# Patient Record
Sex: Male | Born: 1980 | Race: White | Hispanic: No | Marital: Married
Health system: Midwestern US, Community
[De-identification: ages and names within clinical notes are randomized; demographics above are authoritative.]

## PROBLEM LIST (undated history)

## (undated) DIAGNOSIS — M1990 Unspecified osteoarthritis, unspecified site: Secondary | ICD-10-CM

## (undated) DIAGNOSIS — T7840XA Allergy, unspecified, initial encounter: Secondary | ICD-10-CM

## (undated) DIAGNOSIS — F32A Depression, unspecified: Secondary | ICD-10-CM

## (undated) DIAGNOSIS — F329 Major depressive disorder, single episode, unspecified: Secondary | ICD-10-CM

## (undated) DIAGNOSIS — F419 Anxiety disorder, unspecified: Secondary | ICD-10-CM

## (undated) DIAGNOSIS — F1120 Opioid dependence, uncomplicated: Secondary | ICD-10-CM

## (undated) DIAGNOSIS — G5600 Carpal tunnel syndrome, unspecified upper limb: Secondary | ICD-10-CM

## (undated) DIAGNOSIS — G8929 Other chronic pain: Secondary | ICD-10-CM

## (undated) DIAGNOSIS — M25511 Pain in right shoulder: Secondary | ICD-10-CM

## (undated) DIAGNOSIS — M2550 Pain in unspecified joint: Secondary | ICD-10-CM

## (undated) DIAGNOSIS — F410 Panic disorder [episodic paroxysmal anxiety] without agoraphobia: Secondary | ICD-10-CM

## (undated) DIAGNOSIS — S0990XD Unspecified injury of head, subsequent encounter: Secondary | ICD-10-CM

## (undated) HISTORY — DX: Depression, unspecified: F32.A

## (undated) HISTORY — DX: Allergy, unspecified, initial encounter: T78.40XA

## (undated) HISTORY — PX: BICEPT TENODESIS: SHX5116

## (undated) HISTORY — DX: Major depressive disorder, single episode, unspecified: F32.9

## (undated) HISTORY — DX: Carpal tunnel syndrome, unspecified upper limb: G56.00

## (undated) HISTORY — DX: Unspecified osteoarthritis, unspecified site: M19.90

## (undated) HISTORY — DX: Anxiety disorder, unspecified: F41.9

## (undated) HISTORY — DX: Opioid dependence, uncomplicated: F11.20

## (undated) MED ORDER — HYDROCODONE-ACETAMINOPHEN 7.5 MG-750 MG TAB
ORAL_TABLET | Freq: Four times a day (QID) | ORAL | Status: AC | PRN
Start: ? — End: 2008-01-27

## (undated) MED ORDER — HYDROCODONE-ACETAMINOPHEN 7.5 MG-500 MG TAB
ORAL_TABLET | ORAL | Status: AC | PRN
Start: ? — End: 2008-04-18

---

## 2007-04-26 MED ORDER — MEPERIDINE (PF) 50 MG/ML INJ SOLN
50 mg/ml | INTRAMUSCULAR | Status: AC
Start: 2007-04-26 — End: 2007-04-26
  Administered 2007-04-27: via INTRAMUSCULAR

## 2007-04-26 MED ORDER — PROMETHAZINE 25 MG/ML INJECTION
25 mg/mL | Freq: Four times a day (QID) | INTRAMUSCULAR | Status: DC | PRN
Start: 2007-04-26 — End: 2007-04-26
  Administered 2007-04-27: via INTRAMUSCULAR

## 2007-04-26 NOTE — ED Provider Notes (Signed)
Back Pain   The history is provided by the patient. This is a recurrent problem. The current episode started more than 1 week ago. The problem has been unchanged since onset. The problem has been occurring every several days. Patient reports work related injury.The pain is associated with lifting. The pain is present in the lumbar spine. The quality of the pain is stabbing. The pain does not radiate. The pain is at a severity of 5/10. The pain is moderate. The symptoms are worsened by bending. The pain is worse during the day. Pertinent negatives include no chest pain, no abdominal pain, no dysuria, no leg pain, no paresthesias and no weakness.   Medication Refill  Pertinent negatives include no chest pain and no abdominal pain.        Review of Systems   Constitutional: Negative.  Negative for weakness.   Skin: Negative.    HENT: Negative.    Eyes: Negative.    Cardiovascular: Negative.  Negative for chest pain.   Respiratory: Negative.    Gastrointestinal: Negative.  Negative for abdominal pain.   Genitourinary: Negative for dysuria.   Musculoskeletal: Positive for myalgias and back pain.   Neurological: Negative.        Physical Exam   Constitutional: He is oriented and developed, nourished, and not distressed.   HENT:   Head: Normocephalic.   Eyes: Pupils are equal, round, and reactive to light.   Neck: Normal range of motion.   Cardiovascular: Normal rate and regular rhythm.    Pulmonary/Chest: Effort normal and breath sounds normal.   Musculoskeletal: He exhibits tenderness.   Neurological: He is alert and oriented.   Skin: Skin is warm and dry.       Penicillins    History   Social History   ??? Marital Status: Single     Spouse Name: N/A     Number of Children: N/A   ??? Years of Education: N/A   Occupational History   ??? Not on file.   Social History Main Topics   ??? Tobacco Use: Never   ??? Alcohol Use: No   ??? Drug Use: No   ??? Sexually Active: Not Currently   Other Topics Concern   ??? Not on file    Social History Narrative   ??? No narrative on file         ED Plan:

## 2007-04-26 NOTE — ED Notes (Signed)
Pt stained lumbar back 3 wks ago. He has appt with MD next mon. Pt ran out of Lortab.

## 2007-04-26 NOTE — ED Notes (Signed)
Medication given as ordered,waiting shot time, then will discharge home

## 2007-04-26 NOTE — ED Notes (Signed)
Pt states previously seen by Dr. Constance Goltz, given pain medication and told to follow up with orthopedic, states unable to get into one until the 15th, has ran out of pain medication

## 2007-04-26 NOTE — ED Notes (Signed)
Pt states feels much better, discharge instructions given verbal and written, pt verbalizes understanding denies questions

## 2007-04-27 MED FILL — DEMEROL (PF) 50 MG/ML INJECTION SYRINGE: 50 mg/mL | INTRAMUSCULAR | Qty: 1

## 2007-04-27 MED FILL — PROMETHAZINE 25 MG/ML INJECTION: 25 mg/mL | INTRAMUSCULAR | Qty: 1

## 2008-01-20 NOTE — ED Notes (Signed)
I have reviewed discharge instructions with the patient.  The patient verbalized understanding.  To make follow up appt. with referral physician.  Return if gets worse.   Advised not to drive if taking vicodin

## 2008-01-20 NOTE — ED Provider Notes (Signed)
Shoulder Pain   The history is provided by the patient. The incident occurred more than 1 week ago (since last year). There was no injury mechanism. The right shoulder is affected. The pain is at a severity of 10/10. The pain does not radiate. There is no history of shoulder injury. He has no other injuries. There is no history of shoulder surgery.    Has small labral tear with paralabral cyst of right shoulder per MRI 12/08.  Has been seeing his PCP who is no longer there.  Has an appointment scheduled with another PCP in that group on 02/06/08 and is supposed to get a referral to an orthopedist.  Is out of his Lortab.  Has no new pain, no injury.  Just said he needs the "7.5s" to get by.    Past Medical History   Diagnosis Date   ??? Psychiatric Disorder      PTSD          No past surgical history on file.      No family history on file.     History   Social History   ??? Marital Status: Single     Spouse Name: N/A     Number of Children: N/A   ??? Years of Education: N/A   Occupational History   ??? Not on file.   Social History Main Topics   ??? Tobacco Use: Never   ??? Alcohol Use: No   ??? Drug Use: No   ??? Sexually Active: Not Currently   Other Topics Concern   ??? Not on file   Social History Narrative   ??? No narrative on file           ALLERGIES: Penicillins and Erythromycin      Review of Systems   Musculoskeletal: Positive for joint pain.   All other systems reviewed and are negative.      Filed Vitals:    01/20/2008  1:17 PM   BP: 123/86   Pulse: 79   Temp: 98.4 ??F (36.9 ??C)   Resp: 16   Height: 5\' 11"  (1.803 m)   Weight: 219 lb (99.338 kg)   SpO2: 99%              Physical Exam   Nursing note and vitals reviewed.  Constitutional: He is oriented. He appears well-developed and well-nourished. No distress.   HENT:   Head: Normocephalic and atraumatic.   Right Ear: External ear normal.   Left Ear: External ear normal.   Eyes: Conjunctivae are normal. Pupils are equal, round, and reactive to light.    Neck: Normal range of motion. Neck supple.   Cardiovascular: Normal rate and normal heart sounds.  Exam reveals no gallop and no friction rub.    No murmur heard.  Pulmonary/Chest: Effort normal. No respiratory distress. He has no wheezes. He has no rales.   Abdominal: He exhibits no distension. No tenderness.   Musculoskeletal: He exhibits tenderness. He exhibits no edema.   Neurological: He is alert and oriented.   Skin: Skin is warm and dry.   Psychiatric: He has a normal mood and affect. His behavior is normal.        MDM Coding   Reviewed: previous chart, nursing note and vitals

## 2008-01-20 NOTE — ED Notes (Signed)
Saw PMD had MRI showed torn labrum in r shoulder.  Called PMD back for referral to ortho, PMD not at practice.  Has another appt with new family practice dr for referral to ortho in 2 weeks.  Has run out of pain pills.  Does physical labor for job

## 2008-02-07 NOTE — ED Notes (Signed)
Signed chart for administrative requirement but I did not see this patient.

## 2008-04-11 MED ORDER — METHYLPREDNISOLONE 80 MG/ML SUSP FOR INJECTION
80 mg/mL | INTRAMUSCULAR | Status: AC
Start: 2008-04-11 — End: 2008-04-11
  Administered 2008-04-11: 20:00:00 via INTRAMUSCULAR

## 2008-04-11 MED FILL — METHYLPREDNISOLONE 80 MG/ML SUSP FOR INJECTION: 80 mg/mL | INTRAMUSCULAR | Qty: 1

## 2008-04-11 NOTE — ED Notes (Signed)
Pt initially refused DepoMedrol if he was not going to get any pain med.  In reviewing d/c instructions, I informed pt that it appeared that a rx for lortab was to be given.  Pt then agreed to IM DepoMedrol

## 2008-04-11 NOTE — ED Notes (Incomplete)
The patient was observed in the ED.    Results Reviewed:        I discussed the results of all labs, procedures, radiographs, and treatments with the patient and available family.  Treatment plan is agreed upon and the patient is ready for discharge.  All voiced understanding of the discharge plan and medication instructions or changes as appropriate.  Questions about treatment in the ED were answered.  All were encouraged to return should symptoms worsen or new problems develop.

## 2008-04-11 NOTE — ED Provider Notes (Signed)
HPI Comments: Pt informed that The ER is not the place to come for his medication refills    Shoulder Pain   The history is provided by the patient. The incident occurred more than 1 week ago. The injury mechanism is unknown. The right shoulder is affected. The pain is at a severity of 6/10. The pain has been constant since onset. The pain radiates. There is a history of shoulder injury. He has no other injuries. There is no history of shoulder surgery. Pertinent negatives include no numbness and no tingling. He reports no foreign bodies present.   Back Pain   Pertinent negatives include no fever, no numbness, no tingling and no weakness.   Medication Reaction         Past Medical History   Diagnosis Date   ??? Psychiatric Disorder      PTSD   ??? Other Ill-Defined Conditions      torn labrum          No past surgical history on file.      No family history on file.     History   Social History   ??? Marital Status: Single     Spouse Name: N/A     Number of Children: N/A   ??? Years of Education: N/A   Occupational History   ??? Not on file.   Social History Main Topics   ??? Tobacco Use: Never   ??? Alcohol Use: No   ??? Drug Use: No   ??? Sexually Active: Yes -- Male partner(s)     Birth Control/ Protection: Pill   Other Topics Concern   ??? Not on file   Social History Narrative   ??? No narrative on file           ALLERGIES: Penicillins and Erythromycin      Review of Systems   Constitutional: Negative for fever and chills.   Musculoskeletal: Positive for back pain and arthralgias.   Neurological: Negative for tingling, weakness and numbness.   All other systems reviewed and are negative.      Filed Vitals:    04/11/2008  1:38 PM   BP: 124/87   Pulse: 89   Temp: 98.2 ??F (36.8 ??C)   Resp: 20   SpO2: 97%              Physical Exam   Nursing note and vitals reviewed.  Constitutional: He is oriented. He appears well-developed and well-nourished. No distress.   HENT:   Head: Normocephalic and atraumatic.    Eyes: Conjunctivae are normal. Pupils are equal, round, and reactive to light.   Neck: Normal range of motion. Neck supple.   Cardiovascular: Normal rate and regular rhythm.    Pulmonary/Chest: Effort normal. No respiratory distress.   Musculoskeletal:        Right shoulder: He exhibits decreased range of motion, tenderness and pain. He exhibits no deformity.   Neurological: He is alert and oriented. He has normal strength. No sensory deficit.   Skin: Skin is warm and dry.   Psychiatric: He has a normal mood and affect. His behavior is normal.            MDM Coding   Reviewed: previous chart, nursing note and vitals  Reviewed previous: x-ray          Procedures

## 2008-04-11 NOTE — ED Notes (Signed)
Pt has known torn labrum in shoulder.  Also previous back injury.  Has run out of pain meds and does not have surgery scheduled until July.  States MD said to come to ER for meds

## 2008-11-03 MED ORDER — PAROXETINE 20 MG TAB
20 mg | ORAL_TABLET | Freq: Every day | ORAL | Status: AC
Start: 2008-11-03 — End: 2008-12-03

## 2008-11-03 NOTE — ED Notes (Signed)
Dr. Loni Beckwith aware Paxil voided and ripped and placed in shredder.

## 2008-11-03 NOTE — ED Notes (Signed)
Pt called coordinator phone and this RN using to call report on critical pt. Informed pt that I would report to his room as soon as I could to speak to him regarding his concerns. Prior to going to room 16, informed Joy, RN, Higher education careers adviser, of the situation. Joy and I went and obtained discharge rx and instructions. Went and spoke with pt and informed him the MD did evaluate him and that the RN has updated him on delay of MD due to emergencies with pts. Pt further states "my shoulder pain is an emergency." Both RNs apologized for delay and this RN started giving Rx and discharge instructions. Pt and his friend interrupted stating "that won't work... I need my anxiety medicine... I have seizures if I don't take it... My last dose was this morning." explained to pt that MD filled his Paxil and that he should call his MD in the morning. Per pt, MD fired him, but later stated "I can't go until I pay him $300 because of my insurance dropping me." Joy, RN explained as well what the emergency room and the MD can do for him today. Both RNs spoke with pt (and his friend in the room) at length. Pt very rude and demeaning to staff, and would speak to friend stating "[facility] gave me a supply." Gave pt resources and offered Paxil rx numerous times. States "nope... What I really need is my Xanax...." I will add that pt only had empty rx bottles for Xanax and Lortab. Pt got up and states "I'll just go by them on the streets" and his friend said "yeah dude that's right..." and pt states "It's a lot easier than this shit." pt states "I'm not paying the bill either..." then walked out of ER with his friend, both still talking and making rude comments. Inetta Fermo with pt relations notified and Dr. Loni Beckwith notified.

## 2008-11-03 NOTE — ED Notes (Signed)
Pt states he called his doctor for refills, and his doctor told him to come to the ER for referral to a different physician.

## 2008-11-03 NOTE — ED Notes (Signed)
Wants meds refilled. States MD won't refill bc insurance ran out. States $300 to go. Needs meds: Paxil CR 12.5 mg 1 tab daily; Xanax 2 mg 1 tab 3 times daily; Lortab 7.5 mg 1 tab every 4-6 hrs as needed.

## 2008-11-03 NOTE — ED Provider Notes (Signed)
HPI Comments: Presents requesting Lortab, Xanax, & Paxil refills claiming his PCP released him from his practice d/t losing his insurance and told him to go to an ER for his refills.   Attempted to explain hospital policy is to refill only essential medications wh  Emeline Gins excludes pain & anxiety.   Pt then requests to "start all over" after reading the statement on the wall to me that he is entitled to a medical screening exam; however, when I attempted to examine him he asked me to stop because it caused pain and   now wants an MRI of his shoulder.   I apologized for the long wait, but I am unable to placate pt.     Patient is a 28 y.o. male presenting with medication refill. The history is provided by the patient.   Medication Refill         Past Medical History   Diagnosis Date   ??? Psychiatric Disorder      PTSD   ??? Other Ill-Defined Conditions      torn labrum          No past surgical history on file.      No family history on file.     History   Social History   ??? Marital Status: Married     Spouse Name: N/A     Number of Children: N/A   ??? Years of Education: N/A   Occupational History   ??? Not on file.   Social History Main Topics   ??? Tobacco Use: Never   ??? Alcohol Use: No   ??? Drug Use: No   ??? Sexually Active: Yes -- Male partner(s)     Birth Control/ Protection: Pill   Other Topics Concern   ??? Not on file   Social History Narrative   ??? No narrative on file           ALLERGIES: Penicillins and Erythromycin      Review of Systems   Unable to perform ROS  Musculoskeletal: Positive for arthralgias.   Psychiatric/Behavioral: Positive for agitation. The patient is nervous/anxious.        Filed Vitals:    11/03/2008  1:29 PM   BP: 128/79   Pulse: 78   Temp: 97.9 ??F (36.6 ??C)   Resp: 16   Height: 5\' 11"  (1.803 m)   Weight: 225 lb (102.059 kg)   SpO2: 99%              Physical Exam   Vitals reviewed.  Constitutional: He appears well-developed. No distress.   HENT:   Head: Normocephalic.    Cardiovascular: Normal rate.    Pulmonary/Chest: Effort normal.   Musculoskeletal:        Right shoulder: He exhibits tenderness and bony tenderness.        Arms:  Neurological: He is alert.   Psychiatric: His affect is angry. He is agitated and aggressive.            Coding      Procedures

## 2008-11-03 NOTE — Progress Notes (Signed)
Spiritual care visit with pt.  Support  given.    Cindy Bishop, M.Div.  Chaplain

## 2009-01-26 MED ORDER — CEPHALEXIN 500 MG CAP
500 mg | ORAL_CAPSULE | Freq: Three times a day (TID) | ORAL | Status: AC
Start: 2009-01-26 — End: 2009-02-05

## 2009-01-26 MED ORDER — HYDROCODONE-ACETAMINOPHEN 5 MG-500 MG TAB
5-500 mg | ORAL_TABLET | ORAL | Status: DC | PRN
Start: 2009-01-26 — End: 2009-06-05

## 2009-01-26 MED ORDER — PSEUDOEPHEDRINE SR 120 MG TAB
120 mg | ORAL_TABLET | Freq: Two times a day (BID) | ORAL | Status: DC | PRN
Start: 2009-01-26 — End: 2009-06-05

## 2009-01-26 NOTE — ED Provider Notes (Signed)
HPI Comments: Pt. Presents to ER stating that he has had severe right ear pain with fullness x 3 days. He states he is starting to have fullness and pressure in left ear today. He denies any URI symptoms or ST. Denies dizziness, or tinnitus.    The history is provided by the patient.        Past Medical History   Diagnosis Date   ??? Psychiatric Disorder      PTSD   ??? Other Ill-Defined Conditions      torn labrum          No past surgical history on file.      No family history on file.     History   Social History   ??? Marital Status: Married     Spouse Name: N/A     Number of Children: N/A   ??? Years of Education: N/A   Occupational History   ??? Not on file.   Social History Main Topics   ??? Tobacco Use: Never   ??? Alcohol Use: No   ??? Drug Use: No   ??? Sexually Active: Yes -- Male partner(s)     Birth Control/ Protection: Pill   Other Topics Concern   ??? Not on file   Social History Narrative   ??? No narrative on file           ALLERGIES: Penicillins and Erythromycin      Review of Systems   Constitutional: Negative for fever and chills.   HENT: Positive for ear pain. Negative for congestion, sore throat, rhinorrhea, neck pain and neck stiffness.    Gastrointestinal: Negative for nausea and vomiting.   Neurological: Negative for dizziness, light-headedness and headaches.   All other systems reviewed and are negative.        Filed Vitals:    01/26/2009  1:48 PM   BP: 143/93   Pulse: 70   Temp: 98 ??F (36.7 ??C)   Resp: 16   Height: 5\' 11"  (1.803 m)   Weight: 215 lb (97.523 kg)   SpO2: 98%              Physical Exam   Nursing note and vitals reviewed.  Constitutional: He is oriented. He appears well-developed and well-nourished. He is active.  Non-toxic appearance. He does not appear ill. He appears distressed.   HENT:   Head: Normocephalic and atraumatic.   Right Ear: Tympanic membrane is injected, erythematous and bulging.   Left Ear: Tympanic membrane is bulging.   Nose: Nose normal.    Mouth/Throat: Uvula is midline, oropharynx is clear and moist and mucous membranes are normal.        Right TM bulging, erythematous, and injected. Left TM with air-fluid levels.   Eyes: Conjunctivae and extraocular motions are normal. Pupils are equal, round, and reactive to light.   Neck: Normal range of motion. Neck supple.   Cardiovascular: Normal rate, regular rhythm and normal heart sounds.  Exam reveals no gallop and no friction rub.    No murmur heard.  Pulmonary/Chest: Effort normal and breath sounds normal. No respiratory distress. He has no decreased breath sounds. He has no wheezes. He has no rhonchi.   Lymphadenopathy:     He has no cervical adenopathy.   Neurological: He is alert and oriented. No cranial nerve deficit. Coordination normal.   Skin: Skin is warm and dry.   Psychiatric: He has a normal mood and affect. His speech is normal and behavior is normal.  MDM Coding   Reviewed: nursing note and vitals          Procedures

## 2009-01-26 NOTE — ED Notes (Signed)
C/o ear pain in right ear now moving to left ear x 3 days.

## 2009-01-26 NOTE — ED Notes (Signed)
Discharge instructions discussed with verbal understanding.

## 2009-01-26 NOTE — ED Notes (Signed)
I had no involvement in this patient's care or disposition.   This signature is to meet an administrative requirement.

## 2009-01-26 NOTE — ED Notes (Signed)
Advised of delay for a room. Also advised if they feel their condition changes, or they request a recheck, to please present to registration and I will gladly re-assess.

## 2009-06-05 MED ORDER — NAPROXEN SODIUM 550 MG TAB
550 mg | ORAL_TABLET | Freq: Three times a day (TID) | ORAL | Status: DC
Start: 2009-06-05 — End: 2009-08-15

## 2009-06-05 MED ORDER — PROPOXYPHENE N-ACETAMINOPHEN 100 MG-650 MG TAB
100-650 mg | ORAL_TABLET | Freq: Four times a day (QID) | ORAL | Status: AC | PRN
Start: 2009-06-05 — End: 2009-06-12

## 2009-06-05 NOTE — ED Notes (Signed)
I have reviewed discharge instructions with the patient and caregiver.  The patient and caregiver verbalized understanding. Pt d/c in NAD, ambulatory upon d/c.
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
## 2009-08-15 NOTE — ED Notes (Signed)
Patient complaining of throat pain. Patient also has lost voice. Afebrile been treating with ASA.

## 2009-08-15 NOTE — ED Notes (Signed)
Report given to oncoming RN at bedside. Opportunity for questions allowed.

## 2009-08-15 NOTE — ED Notes (Signed)
Pt states he developed sore throat yesterday and bilateral ear pain approximately three hours ago. Denies and fever or cough.

## 2009-08-15 NOTE — ED Provider Notes (Signed)
HPI Comments: Here with sore throat /pain to swallow for about one day. No lower respiratory involvement. Not a smoker.voice is hoarse.    Patient is a 28 y.o. male presenting with sore throat and ear pain. The history is provided by the patient.   Sore Throat   This is a new problem. The current episode started yesterday. The problem has not changed since onset. There has been no fever. Associated symptoms include ear pain and trouble swallowing. Pertinent negatives include no diarrhea, no vomiting, no congestion, no ear discharge, no headaches, no shortness of breath, no swollen glands, no stiff neck and no cough. He has had no exposure to strep and no exposure to mono.   Ear Pain   Associated symptoms include sore throat. Pertinent negatives include no ear discharge, no headaches, no diarrhea, no vomiting and no cough.        Past Medical History   Diagnosis Date   ??? Psychiatric disorder      PTSD   ??? Other ill-defined conditions      torn labrum          No past surgical history on file.      No family history on file.     History   Social History   ??? Marital Status: Married     Spouse Name: N/A     Number of Children: N/A   ??? Years of Education: N/A   Occupational History   ??? Not on file.   Social History Main Topics   ??? Tobacco Use: Never   ??? Alcohol Use: No   ??? Drug Use: No   ??? Sexually Active: Yes -- Male partner(s)     Birth Control/ Protection: Pill   Other Topics Concern   ??? Not on file   Social History Narrative   ??? No narrative on file           ALLERGIES: Penicillins and Erythromycin      Review of Systems   Constitutional: Negative for fever and chills.   HENT: Positive for ear pain, sore throat and trouble swallowing. Negative for congestion and ear discharge.    Respiratory: Negative for cough and shortness of breath.    Gastrointestinal: Negative for vomiting and diarrhea.   Neurological: Negative for headaches.   All other systems reviewed and are negative.        Filed Vitals:     08/15/2009  8:44 PM   BP: 140/86   Pulse: 90   Temp: 98.4 ??F (36.9 ??C)   Resp: 18   Height: 5\' 11"  (1.803 m)   Weight: 220 lb (99.791 kg)   SpO2: 98%              Physical Exam   Constitutional: He is oriented to person, place, and time. He appears well-developed.   HENT:   Head: Atraumatic. No trismus in the jaw.   Right Ear: Tympanic membrane and ear canal normal.   Left Ear: Tympanic membrane normal.   Nose: Nose normal.   Mouth/Throat: No uvula swelling. No oropharyngeal exudate or posterior oropharyngeal edema.   Neck: Normal range of motion and full passive range of motion without pain. Tracheal tenderness present. No tracheal deviation and no edema present.   Cardiovascular: Normal rate, normal heart sounds and intact distal pulses.    Pulmonary/Chest: Effort normal. No stridor. No respiratory distress. He has no wheezes.   Abdominal: Soft.   Musculoskeletal: Normal range of motion.   Neurological: He  is alert and oriented to person, place, and time.   Skin: Skin is warm and dry. No erythema.   Psychiatric: He has a normal mood and affect. His behavior is normal. Thought content normal.        MDM Coding   Reviewed: nursing note and vitals  Interpretation: labs        Procedures    Recent Results (from the past 12 hour(s))   STREP AG SCREEN, GROUP A    Collection Time    08/15/09 11:50 PM   Component Value Range   ??? Group A Strep Ag ID NEGATIVE   NEGATIVE

## 2009-08-16 LAB — STREP AG SCREEN, GROUP A: Group A Strep Ag ID: NEGATIVE

## 2009-08-16 MED ORDER — PREDNISONE 20 MG TAB
20 mg | ORAL | Status: AC
Start: 2009-08-16 — End: 2009-08-16
  Administered 2009-08-16: 05:00:00 via ORAL

## 2009-08-16 MED ORDER — HYDROCODONE-ACETAMINOPHEN 7.5 MG-500 MG TAB
ORAL_TABLET | Freq: Four times a day (QID) | ORAL | Status: AC | PRN
Start: 2009-08-16 — End: 2009-08-18

## 2009-08-16 MED ORDER — METHYLPREDNISOLONE 4 MG TABS IN A DOSE PACK
4 mg | PACK | ORAL | Status: DC
Start: 2009-08-16 — End: 2009-09-17

## 2009-08-16 MED ORDER — DOXYCYCLINE HYCLATE 100 MG TAB
100 mg | ORAL_TABLET | Freq: Two times a day (BID) | ORAL | Status: AC
Start: 2009-08-16 — End: 2009-08-23

## 2009-08-16 MED FILL — PREDNISONE 20 MG TAB: 20 mg | ORAL | Qty: 3

## 2009-08-16 NOTE — ED Notes (Signed)
Patient signed his discharge but computer did not accept it and patient was gone when realized this.    I have reviewed discharge instructions with the patient.  The patient verbalized understanding.

## 2009-08-16 NOTE — ED Notes (Signed)
Throat culture recollected at 2355;  Had been sent to lab by previous shift with no patient label.  Patient advised of situation.

## 2009-08-18 LAB — CULTURE, STREP THROAT

## 2009-08-23 NOTE — ED Notes (Signed)
Pt was seen here last Friday for similar symptoms.  Pt presents tonight w/ cough, sore throat "and it hurts my lungs to take a deep breath. My mom thinks I have pneumonia, so I came back in."  Pt states he finished his medrol pack and antiobiotics.

## 2009-08-24 MED ADMIN — ketorolac tromethamine (TORADOL) 60 mg/2 mL injection 60 mg: INTRAMUSCULAR | @ 05:00:00 | NDC 10019003017

## 2009-08-24 MED FILL — KETOROLAC TROMETHAMINE 60 MG/2 ML IM: 60 mg/2 mL | INTRAMUSCULAR | Qty: 2

## 2009-08-24 NOTE — ED Provider Notes (Signed)
Patient is a 28 y.o. male presenting with cough and shortness of breath. The history is provided by the patient. No language interpreter was used.   Cough  This is a recurrent problem. The current episode started more than 1 week ago (x 10 days). The problem occurs constantly. The problem has been gradually improving. The cough is non-productive. There has been no fever. Associated symptoms include chest pain, sore throat and shortness of breath. Pertinent negatives include no chills, no sweats, no weight loss, no eye redness, no ear congestion, no ear pain, no headaches, no rhinorrhea, no myalgias, no wheezing, no nausea, no vomiting and no confusion. He has tried antibiotics, prescription drugs and steroids for the symptoms. The treatment provided mild relief. He is not a smoker. His past medical history is significant for bronchitis. His past medical history does not include pneumonia, bronchiectasis, COPD, emphysema, asthma, cancer, heart failure or CHF.   Shortness of Breath  This is a new problem. The current episode started 12 to 24 hours ago. Associated symptoms include sore throat, cough and chest pain. Pertinent negatives include no headaches, no rhinorrhea, no ear pain, no wheezing and no vomiting. It is unknown what precipitated the problem. He has tried nothing for the symptoms. He has had no prior hospitalizations. He has had prior ED visits. Associated medical issues do not include asthma, COPD, pneumonia, chronic lung disease, PE, CAD, heart failure, past MI, DVT or recent surgery.        Past Medical History   Diagnosis Date   ??? Psychiatric disorder      PTSD   ??? Other ill-defined conditions      torn labrum          No past surgical history on file.      No family history on file.     History   Social History   ??? Marital Status: Married     Spouse Name: N/A     Number of Children: N/A   ??? Years of Education: N/A   Occupational History   ??? Not on file.   Social History Main Topics    ??? Tobacco Use: Never   ??? Alcohol Use: No   ??? Drug Use: No   ??? Sexually Active: Yes -- Male partner(s)     Birth Control/ Protection: Pill   Other Topics Concern   ??? Not on file   Social History Narrative   ??? No narrative on file           ALLERGIES: Penicillins and Erythromycin      Review of Systems   Constitutional: Negative for chills and weight loss.   HENT: Positive for sore throat. Negative for ear pain and rhinorrhea.    Eyes: Negative for redness.   Respiratory: Positive for cough and shortness of breath. Negative for wheezing.    Cardiovascular: Positive for chest pain.   Gastrointestinal: Negative for nausea and vomiting.   Musculoskeletal: Negative for myalgias.   Neurological: Negative for headaches.   Psychiatric/Behavioral: Negative for confusion.   All other systems reviewed and are negative.        Filed Vitals:    08/23/2009 11:56 PM   BP: 160/87   Pulse: 86   Temp: 98.4 ??F (36.9 ??C)   Resp: 18   Height: 5\' 11"  (1.803 m)   Weight: 220 lb (99.791 kg)   SpO2: 100%              Physical Exam   Nursing  note and vitals reviewed.  Constitutional: He is oriented to person, place, and time. He appears well-developed and well-nourished. No distress.   HENT:   Head: Normocephalic and atraumatic.   Right Ear: External ear normal.   Left Ear: External ear normal.   Nose: Nose normal.   Mouth/Throat: Oropharynx is clear and moist. No oropharyngeal exudate.   Eyes: Conjunctivae and extraocular motions are normal. Pupils are equal, round, and reactive to light. Right eye exhibits no discharge. Left eye exhibits no discharge. No scleral icterus.   Neck: Normal range of motion. Neck supple. No JVD present. No tracheal deviation present. No thyromegaly present.   Cardiovascular: Normal rate, regular rhythm, normal heart sounds and intact distal pulses.  Exam reveals no gallop and no friction rub.    No murmur heard.   Pulmonary/Chest: Effort normal and breath sounds normal. No stridor. No respiratory distress. He has no wheezes. He has no rales. He exhibits no tenderness.   Abdominal: Soft. Bowel sounds are normal. He exhibits no distension and no mass. No tenderness. He has no rebound and no guarding.   Musculoskeletal: Normal range of motion. He exhibits no edema and no tenderness.   Lymphadenopathy:     He has no cervical adenopathy.   Neurological: He is alert and oriented to person, place, and time. No cranial nerve deficit. He exhibits normal muscle tone. Coordination normal.   Skin: Skin is warm and dry. No rash noted. He is not diaphoretic. No erythema. No pallor.   Psychiatric: He has a normal mood and affect. His behavior is normal.        MDM Coding   Interpretation: x-ray        Procedures    The patient was observed in the ED.  PE was unremarkable.  No coughing or SOB during his visit.    Results Reviewed:  CXR - NAD    I discussed the results of the radiographs with the patient.  Treatment plan is agreed upon and the patient is ready for discharge.  All voiced understanding of the discharge plan and medication instructions or changes as appropriate.  Questions about treatment in the ED were answered.  All were encouraged to return should symptoms worsen or new problems develop.

## 2009-08-24 NOTE — ED Notes (Signed)
Discharge instructions covered verbally.  Pt verbalized understanding.

## 2009-09-17 MED FILL — BSS INTRAOCULAR SOLUTION: INTRAOCULAR | Qty: 15

## 2009-09-17 MED FILL — TETRACAINE 0.5 % EYE DROPS: 0.5 % | OPHTHALMIC | Qty: 2

## 2009-09-17 MED FILL — FUL-GLO 1 MG EYE STRIPS: 1 mg | OPHTHALMIC | Qty: 1

## 2009-09-17 NOTE — ED Notes (Signed)
I have reviewed discharge instructions with the patient.  The patient verbalized understanding.Pt discharged ambulatory in stable condition with rx's and instructions.

## 2009-09-17 NOTE — ED Notes (Signed)
Advised of delay for a room. Also advised if they feel their condition changes, or they request a recheck, to please present to registration and I will gladly re-assess.

## 2009-09-17 NOTE — ED Provider Notes (Cosign Needed)
Patient is a 28 y.o. male presenting with eye pain. The history is provided by the patient.   Eye Pain   This is a new problem. The current episode started 6 to 12 hours ago. The problem occurs constantly. The problem has been gradually improving. There is pain in the right eye. The injury mechanism was a foreign body (was working under car and something dropped in right eye). The pain is at a severity of 6/10. There is no history of trauma to the eye. There is no known exposure to pink eye. He does not wear contacts. Associated symptoms include foreign body sensation, eye redness, negative and pain. Pertinent negatives include no numbness, no blurred vision, no decreased vision, no discharge, no double vision, no photophobia, no nausea, no vomiting, no tingling, no weakness, no itching, no fever, no blindness, no Head Injury and no dizziness. He has tried water for the symptoms. The treatment provided mild relief.        Past Medical History   Diagnosis Date   ??? Psychiatric disorder      PTSD   ??? Other ill-defined conditions      torn labrum          No past surgical history on file.      No family history on file.     History   Social History   ??? Marital Status: Single     Spouse Name: N/A     Number of Children: N/A   ??? Years of Education: N/A   Occupational History   ??? Not on file.   Social History Main Topics   ??? Tobacco Use: Never   ??? Alcohol Use: No   ??? Drug Use: No   ??? Sexually Active: Yes -- Male partner(s)     Birth Control/ Protection: Pill   Other Topics Concern   ??? Not on file   Social History Narrative   ??? No narrative on file           ALLERGIES: Penicillins and Erythromycin      Review of Systems   Constitutional: Negative.  Negative for fever and chills.   HENT: Negative.  Negative for ear pain, congestion, sore throat, rhinorrhea, sneezing, drooling, mouth sores, trouble swallowing, neck pain, neck stiffness, voice change, postnasal drip and sinus pressure.     Eyes: Positive for pain and redness. Negative for blindness, blurred vision, double vision, photophobia and discharge.   Respiratory: Negative.  Negative for cough, chest tightness and shortness of breath.    Cardiovascular: Negative.  Negative for chest pain, palpitations and leg swelling.   Gastrointestinal: Negative.  Negative for nausea, vomiting, abdominal pain, diarrhea and constipation.   Genitourinary: Negative.  Negative for dysuria and hematuria.   Musculoskeletal: Negative.  Negative for myalgias and arthralgias.   Skin: Negative.  Negative for rash and itching.   Neurological: Negative.  Negative for dizziness, tingling, weakness, light-headedness, numbness and headaches.   Psychiatric/Behavioral: Negative.    All other systems reviewed and are negative.        Filed Vitals:    09/17/2009  5:13 PM   BP: 135/82   Pulse: 87   Temp: 98 ??F (36.7 ??C)   Resp: 16   Height: 5\' 11"  (1.803 m)   Weight: 225 lb (102.059 kg)   SpO2: 94%              Physical Exam   Nursing note and vitals reviewed.  Constitutional: He is oriented to person,  place, and time. He appears well-developed and well-nourished. No distress.   HENT:   Head: Normocephalic and atraumatic.   Eyes: Conjunctivae and extraocular motions are normal. Pupils are equal, round, and reactive to light.   Slit lamp exam:       The right eye shows no corneal abrasion, no corneal flare, no corneal ulcer, no foreign body and no fluorescein uptake.   Neck: Normal range of motion. Neck supple.   Cardiovascular: Normal rate, regular rhythm, normal heart sounds and intact distal pulses.    Pulmonary/Chest: Effort normal and breath sounds normal. No respiratory distress.   Abdominal: Soft. Bowel sounds are normal. No tenderness.   Musculoskeletal: Normal range of motion.   Neurological: He is alert and oriented to person, place, and time. He has normal reflexes.   Skin: Skin is warm and dry. He is not diaphoretic.    Psychiatric: He has a normal mood and affect. His behavior is normal. Judgment and thought content normal.        MDM Coding   Reviewed: nursing note and vitals        Procedures    Girlfriend states she saw a small black object on right iris, but not there now

## 2009-09-18 MED ORDER — TRIMETHOPRIM-POLYMYXIN B 0.1 %-10,000 UNIT/ML EYE DROPS
10000 unit- 1 mg/mL | OPHTHALMIC | Status: AC
Start: 2009-09-18 — End: 2009-09-24

## 2009-09-18 MED ADMIN — balanced salt solution (BSS) ophthalmic solution Soln: OPHTHALMIC | NDC 00065079515

## 2009-09-18 MED ADMIN — tetracaine (PONTOCAINE) 0.5 % ophthalmic solution 2 Drop: OPHTHALMIC | NDC 00065074112

## 2009-09-18 MED ADMIN — fluorescein (FUL-GLO) 1 mg ophthalmic strip 1 Strip: OPHTHALMIC | NDC 17478040401

## 2010-02-25 NOTE — ED Provider Notes (Signed)
HPI Comments: Patient complains of pain in his right lower back for 2 days intermittently. No radiation to abdomen or legs. No weakness or paresthesias. No hematuria, dysuria. No nausea or vomiting. No fever. States the pain wakes him up when he needs to urinate and is relieved after he urinates. He has been drinking cranberry juice and taking tylenol.    Patient is a 29 y.o. male presenting with flank pain. The history is provided by the patient and medical records.   Flank Pain   This is a new problem. The current episode started 2 days ago. The problem has not changed since onset. Patient reports no work related injury.The pain is associated with no known injury. The pain is present in the lower back and right side. The quality of the pain is described as dull. The pain does not radiate. Pertinent negatives include no fever, no numbness, no abdominal pain, no bowel incontinence, no perianal numbness, no bladder incontinence, no dysuria, no paresthesias, no paresis, no tingling and no weakness. Treatments tried: tylenol. The treatment provided no relief.        Past Medical History   Diagnosis Date   ??? Psychiatric disorder      PTSD   ??? Other ill-defined conditions      torn labrum          No past surgical history on file.      No family history on file.     History   Social History   ??? Marital Status: Single     Spouse Name: N/A     Number of Children: N/A   ??? Years of Education: N/A   Occupational History   ??? Not on file.   Social History Main Topics   ??? Smoking status: Never Smoker    ??? Smokeless tobacco: Current User   ??? Alcohol Use: No   ??? Drug Use: No   ??? Sexually Active: Yes -- Male partner(s)     Birth Control/ Protection: Pill   Other Topics Concern   ??? Not on file   Social History Narrative   ??? No narrative on file           ALLERGIES: Penicillins and Erythromycin      Review of Systems   Constitutional: Negative.  Negative for fever.   Respiratory: Negative.    Cardiovascular: Negative.     Gastrointestinal: Negative.  Negative for abdominal pain.   Genitourinary: Positive for flank pain. Negative for bladder incontinence, dysuria, urgency, frequency, hematuria, difficulty urinating and testicular pain.   Musculoskeletal: Positive for back pain. Negative for gait problem.   Neurological: Negative.  Negative for tingling, weakness and numbness.       Filed Vitals:    02/25/10 1820   BP: 159/94   Pulse: 86   Temp: 98.3 ??F (36.8 ??C)   Resp: 18   Height: 5\' 11"  (1.803 m)   Weight: 225 lb (102.059 kg)   SpO2: 98%              Physical Exam   Nursing note and vitals reviewed.  Constitutional: He is oriented to person, place, and time. He appears well-developed and well-nourished. No distress.   Cardiovascular: Normal rate, regular rhythm, normal heart sounds and intact distal pulses.    Pulmonary/Chest: Effort normal and breath sounds normal.   Abdominal: Soft. Bowel sounds are normal. He exhibits no distension and no mass. No tenderness.   Musculoskeletal: Normal range of motion. He exhibits no edema  and no tenderness.        Negative straight leg raise. No hip pain with ROM. NV intact to extremities. No weakness. Spine nontender. Mild tenderness to right lumbar paraspinous muscles. No swelling or redness.   Neurological: He is alert and oriented to person, place, and time. He displays normal reflexes. He exhibits normal muscle tone. Coordination normal.   Skin: Skin is warm and dry. He is not diaphoretic.        MDM    Procedures    Back pain  Urinalysis normal with no blood or infection.   OTC NSAIDS recommended.   Results and instructions to patient.

## 2010-02-25 NOTE — ED Notes (Signed)
I have reviewed discharge instructions with the patient.  The patient verbalized understanding.    No prescriptions.  No questions

## 2010-05-11 MED ORDER — HYDROCODONE-ACETAMINOPHEN 5 MG-500 MG TAB
5-500 mg | ORAL_TABLET | ORAL | Status: DC | PRN
Start: 2010-05-11 — End: 2010-07-06

## 2010-05-11 MED ORDER — TRIMETHOPRIM-SULFAMETHOXAZOLE 160 MG-800 MG TAB
160-800 mg | ORAL_TABLET | Freq: Two times a day (BID) | ORAL | Status: AC
Start: 2010-05-11 — End: 2010-05-18

## 2010-05-11 NOTE — ED Provider Notes (Addendum)
HPI Comments: Shane Wagner is a 29 y.o. Caucasian male who c/o left ear pain, sore throat and green nasal drainage. Some cough. No shortness of breath. No n/v/d. Hurts to swallow. No fever.          Patient is a 29 y.o. male presenting with sore throat. The history is provided by the patient.   Sore Throat   Associated symptoms include ear pain and trouble swallowing. Pertinent negatives include no ear discharge, no plugged ear sensation and no stiff neck.        Past Medical History   Diagnosis Date   ??? Psychiatric disorder      PTSD   ??? Other ill-defined conditions      torn labrum          No past surgical history on file.      No family history on file.     History   Social History   ??? Marital Status: Single     Spouse Name: N/A     Number of Children: N/A   ??? Years of Education: N/A   Occupational History   ??? Not on file.   Social History Main Topics   ??? Smoking status: Never Smoker    ??? Smokeless tobacco: Current User   ??? Alcohol Use: No   ??? Drug Use: No   ??? Sexually Active: Yes -- Male partner(s)     Birth Control/ Protection: Pill   Other Topics Concern   ??? Not on file   Social History Narrative   ??? No narrative on file                    ALLERGIES: Penicillins and Erythromycin      Review of Systems   HENT: Positive for ear pain, sore throat and trouble swallowing. Negative for ear discharge.    All other systems reviewed and are negative.        Filed Vitals:    05/11/10 0932   BP: 160/95   Pulse: 71   Temp: 98.3 ??F (36.8 ??C)   Resp: 18   Height: 5\' 11"  (1.803 m)   Weight: 225 lb (102.059 kg)   SpO2: 100%              Physical Exam   Nursing note and vitals reviewed.  Constitutional: He is oriented to person, place, and time. He appears well-developed and well-nourished. No distress.   HENT:   Head: Normocephalic and atraumatic.   Right Ear: External ear normal.   Left Ear: External ear normal.        Left TM angry red.    Tonsils red, uvula midline, no trismus or drooling.    Eyes: Conjunctivae and EOM are normal. Pupils are equal, round, and reactive to light.   Neck: Normal range of motion. Neck supple.   Cardiovascular: Normal rate, regular rhythm and normal heart sounds.    Pulmonary/Chest: Effort normal and breath sounds normal.   Musculoskeletal: Normal range of motion.   Lymphadenopathy:     He has cervical adenopathy.   Neurological: He is alert and oriented to person, place, and time.   Skin: Skin is warm and dry. He is not diaphoretic.   Psychiatric: He has a normal mood and affect. His behavior is normal.        MDM    Procedures

## 2010-05-11 NOTE — ED Notes (Signed)
I have reviewed discharge instructions with the patient.  The patient verbalized understanding. prescription given to pt. Pt ambulatory. Pt not in any distress.

## 2010-07-06 LAB — STREP AG SCREEN, GROUP A: Group A Strep Ag ID: NEGATIVE

## 2010-07-06 MED ORDER — AMOXICILLIN 500 MG TABLET
500 mg | ORAL_TABLET | Freq: Three times a day (TID) | ORAL | Status: DC
Start: 2010-07-06 — End: 2010-08-11

## 2010-07-06 NOTE — ED Notes (Signed)
Pt with right sided ear ache and sore throat since yesterday

## 2010-07-06 NOTE — ED Provider Notes (Signed)
HPI Comments: Pt has had ST and R ear pain since yesterday with subjective F. Also has cough. Denies N/V.     Patient is a 29 y.o. male presenting with ear pain and sore throat. The history is provided by the patient.   Ear Pain   This is a new problem. The current episode started yesterday. The problem occurs constantly. The problem has not changed since onset. Patient complains that the right ear is affected.  Patient reports a subjective fever - was not measured.Associated symptoms include rhinorrhea, sore throat and cough. Pertinent negatives include no abdominal pain, no diarrhea, no vomiting, no neck pain and no rash.   Sore Throat   Associated symptoms include ear pain and cough. Pertinent negatives include no diarrhea and no vomiting.        Past Medical History   Diagnosis Date   ??? Psychiatric disorder      PTSD   ??? Other ill-defined conditions      torn labrum          No past surgical history on file.      No family history on file.     History   Social History   ??? Marital Status: Single     Spouse Name: N/A     Number of Children: N/A   ??? Years of Education: N/A   Occupational History   ??? Not on file.   Social History Main Topics   ??? Smoking status: Never Smoker    ??? Smokeless tobacco: Current User   ??? Alcohol Use: No   ??? Drug Use: No   ??? Sexually Active: Yes -- Male partner(s)     Birth Control/ Protection: Pill   Other Topics Concern   ??? Not on file   Social History Narrative   ??? No narrative on file                    ALLERGIES: Penicillins and Erythromycin      Review of Systems   Constitutional: Negative.  Negative for fever, diaphoresis and unexpected weight change.   HENT: Positive for ear pain, sore throat and rhinorrhea. Negative for neck pain.    Respiratory: Positive for cough.    Cardiovascular: Negative for chest pain and palpitations.   Gastrointestinal: Negative for vomiting, abdominal pain and diarrhea.   Skin: Negative for color change, pallor and rash.    Psychiatric/Behavioral: Negative.  Negative for behavioral problems, confusion, self-injury and agitation.   All other systems reviewed and are negative.        Filed Vitals:    07/06/10 1643 07/06/10 1736   BP: 145/81 141/85   Pulse: 87 84   Temp: 98.1 ??F (36.7 ??C)    Resp: 18 18   Height: 5\' 11"  (1.803 m)    Weight: 220 lb (99.791 kg)    SpO2: 98% 100%              Physical Exam   Nursing note and vitals reviewed.  Constitutional: He is oriented to person, place, and time. He appears well-developed and well-nourished.  Non-toxic appearance. He does not have a sickly appearance. He does not appear ill. No distress.   HENT:   Head: Normocephalic and atraumatic.   Right Ear: There is swelling and tenderness. Tympanic membrane is erythematous.   Left Ear: Hearing, tympanic membrane, external ear and ear canal normal.   Eyes: Conjunctivae and EOM are normal. Pupils are equal, round, and reactive to light.  Neck: Normal range of motion. Neck supple.   Cardiovascular: Normal rate, regular rhythm and normal heart sounds.    Pulmonary/Chest: Effort normal and breath sounds normal. No respiratory distress.   Abdominal: Soft. No tenderness.   Musculoskeletal: Normal range of motion. He exhibits no edema and no tenderness.   Neurological: He is alert and oriented to person, place, and time.   Skin: Skin is warm and dry. He is not diaphoretic.   Psychiatric: He has a normal mood and affect. His behavior is normal. Judgment and thought content normal.        MDM    Procedures     I have discussed the results of labs, procedures, radiographs, treatments as well as any previous results found within the [facility]. [employer] with the patient and available family.?? A treatment plan was developed in conjunction with the patient and was agreed upon. The patient is ready for discharge at this time.?? All voiced understanding of the discharge plan and medication instructions or changes as appropriate.?? Questions about treatment in the ED were answered.?? The patient was encouraged to return should symptoms worsen or new problems develop. A follow up physician was provided to the patient on the discharge papers.

## 2010-07-06 NOTE — ED Notes (Signed)
I have reviewed discharge instructions with the patient.  The patient verbalized understanding.      Pt given RX for following medications at discharge New prescriptions   Medication Sig Dispense Refill   ??? amoxicillin 500 mg Tab Take 500 mg by mouth three (3) times daily.  42 Tab  0     .  Pt discharged ambulatory from emergency department in no acute distress.

## 2010-07-09 LAB — CULTURE, STREP THROAT

## 2010-08-11 MED ORDER — TRIMETHOPRIM-SULFAMETHOXAZOLE 160 MG-800 MG TAB
160-800 mg | ORAL_TABLET | Freq: Two times a day (BID) | ORAL | Status: AC
Start: 2010-08-11 — End: 2010-08-21

## 2010-08-11 MED ORDER — DOXYCYCLINE 100 MG CAP
100 mg | ORAL_CAPSULE | Freq: Two times a day (BID) | ORAL | Status: AC
Start: 2010-08-11 — End: 2010-08-21

## 2010-08-11 MED ORDER — LIDOCAINE-EPINEPHRINE 1 %-1:100,000 IJ SOLN
1 %-:00,000 | INTRAMUSCULAR | Status: DC
Start: 2010-08-11 — End: 2010-08-11

## 2010-08-11 NOTE — ED Notes (Signed)
I have reviewed discharge instructions with the patient.  The patient verbalized understanding.    Prescription for Bactrim and Doxycycline.  No questions.

## 2010-08-11 NOTE — ED Provider Notes (Signed)
HPI Comments: Pt is a 29 yr old male c/o 'cyst on butt' which is on his upper R thigh below the scrotum x 3 days. States it has been draining, clear fluid drained after pus followed by blood. Denies other symptoms. Denies F.     Patient is a 29 y.o. male presenting with skin problem. The history is provided by the patient.   Skin Problem  This is a new problem. The current episode started more than 2 days ago. The problem occurs constantly. The problem has not changed since onset. Pertinent negatives include no chest pain and no shortness of breath. Nothing aggravates the symptoms. Nothing relieves the symptoms. He has tried a warm compress for the symptoms. The treatment provided moderate relief.        Past Medical History   Diagnosis Date   ??? Psychiatric disorder      PTSD   ??? Other ill-defined conditions      torn labrum          No past surgical history on file.      No family history on file.     History   Social History   ??? Marital Status: Single     Spouse Name: N/A     Number of Children: N/A   ??? Years of Education: N/A   Occupational History   ??? Not on file.   Social History Main Topics   ??? Smoking status: Never Smoker    ??? Smokeless tobacco: Current User   ??? Alcohol Use: No   ??? Drug Use: No   ??? Sexually Active: Yes -- Male partner(s)     Birth Control/ Protection: Pill   Other Topics Concern   ??? Not on file   Social History Narrative   ??? No narrative on file                    ALLERGIES: Penicillins and Erythromycin      Review of Systems   Constitutional: Negative.  Negative for fever, diaphoresis and unexpected weight change.   Respiratory: Negative for cough and shortness of breath.    Cardiovascular: Negative for chest pain and palpitations.   Skin: Positive for wound. Negative for color change and pallor.   Psychiatric/Behavioral: Negative.  Negative for behavioral problems, confusion, self-injury and agitation.   All other systems reviewed and are negative.        Filed Vitals:    08/11/10 1533    BP: 139/87   Pulse: 61   Temp: 98.6 ??F (37 ??C)   Resp: 16   Height: 5\' 11"  (1.803 m)   Weight: 225 lb (102.059 kg)   SpO2: 99%              Physical Exam   Nursing note and vitals reviewed.  Constitutional: He is oriented to person, place, and time. He appears well-developed and well-nourished.  Non-toxic appearance. He does not have a sickly appearance. He does not appear ill. No distress.   HENT:   Head: Normocephalic and atraumatic.   Eyes: Conjunctivae and EOM are normal. Pupils are equal, round, and reactive to light.   Neck: Normal range of motion. Neck supple.   Cardiovascular: Normal rate, regular rhythm and normal heart sounds.    Pulmonary/Chest: Effort normal and breath sounds normal. No respiratory distress.   Abdominal: Soft. No tenderness.   Musculoskeletal: Normal range of motion. He exhibits no edema and no tenderness.   Neurological: He is alert and  oriented to person, place, and time.   Skin: Skin is warm and dry. He is not diaphoretic.          Psychiatric: He has a normal mood and affect. His behavior is normal. Judgment and thought content normal.        MDM    Procedures    I have discussed the results of labs, procedures, radiographs, treatments as well as any previous results found within the [facility]. [employer] with the patient and available family.?? A treatment plan was developed in conjunction with the patient and was agreed upon. The patient is ready for discharge at this time.?? All voiced understanding of the discharge plan and medication instructions or changes as appropriate.?? Questions about treatment in the ED were answered.?? The patient was encouraged to return should symptoms worsen or new problems develop. A follow up physician was provided to the patient on the discharge papers.

## 2010-12-20 MED ORDER — AMOXICILLIN 500 MG TABLET
500 mg | ORAL_TABLET | Freq: Three times a day (TID) | ORAL | Status: DC
Start: 2010-12-20 — End: 2011-02-25

## 2010-12-20 MED ORDER — HYDROCODONE-ACETAMINOPHEN 5 MG-500 MG TAB
5-500 mg | ORAL_TABLET | ORAL | Status: AC | PRN
Start: 2010-12-20 — End: 2010-12-27

## 2010-12-20 MED ORDER — CLINDAMYCIN 300 MG CAP
300 mg | ORAL_CAPSULE | Freq: Four times a day (QID) | ORAL | Status: AC
Start: 2010-12-20 — End: 2010-12-27

## 2010-12-20 MED ORDER — IBUPROFEN 800 MG TAB
800 mg | ORAL_TABLET | Freq: Four times a day (QID) | ORAL | Status: AC | PRN
Start: 2010-12-20 — End: 2010-12-27

## 2010-12-20 NOTE — ED Notes (Signed)
Pt calls, states he can't afford the clindamycin. Dr. Phylliss Bob states it is the cheapest medication he can write, considering his allergies. This patient confirms he gets an itchy rash with PCN and erythromycin "but that's when I was a kid". Despite a lengthy conversation repeating the same information he states he would rather have the rash and take the PCN than this sore throat. He is informed Dr. Phylliss Bob will not write him a prescription that he has a known allergy to. He states he will come back and "you can give me a couple shots of this stuff". Advised he will need subsequent doses of the AB and needs to start the oral medication. He is advised to talk with the pharmacy and see if they will fill 2 days worth "until I get paid, then I could get the rest", after we talk. He states then that he only wants to fill the pain medication, not the AB but the pharmacy won't let him do this unless we okay it. Advised we will not okay that because his infection will get worse if he doesn't start the AB. He states repeatedly "I just took amoxicillin for strep throat 2 weeks ago and I have some of it left". Advised that this will probably not work, as he didn't complete the last prescription. We also discuss that amoxil is a pCN medication and he would likely have an allergic reaction. He is quite persistent and will not end the conversation stating "you have to give me something cheaper, just tell him to write amoxicillin". The conversation becomes circular and he finally ends it by stating he would talk to the pharmacy again.

## 2010-12-20 NOTE — ED Provider Notes (Signed)
Patient is a 30 y.o. male presenting with sore throat. The history is provided by the patient.   Sore Throat   This is a new problem. The current episode started 12 to 24 hours ago. The problem has been gradually worsening. Patient reports a subjective fever - was not measured.The fever has been present for less than 1 day. Pertinent negatives include no diarrhea, no vomiting, no congestion, no drooling, no ear discharge, no ear pain, no headaches, no plugged ear sensation, no shortness of breath, no stridor, no swollen glands, no trouble swallowing, no stiff neck and no cough. He has tried nothing for the symptoms.        Past Medical History   Diagnosis Date   ??? Psychiatric disorder      PTSD   ??? Other ill-defined conditions      torn labrum        No past surgical history on file.      No family history on file.     History     Social History   ??? Marital Status: Single     Spouse Name: N/A     Number of Children: N/A   ??? Years of Education: N/A     Occupational History   ??? Not on file.     Social History Main Topics   ??? Smoking status: Never Smoker    ??? Smokeless tobacco: Current User   ??? Alcohol Use: No   ??? Drug Use: No   ??? Sexually Active: Yes -- Male partner(s)     Birth Control/ Protection: Pill     Other Topics Concern   ??? Not on file     Social History Narrative   ??? No narrative on file                  ALLERGIES: Erythromycin and Penicillins      Review of Systems   HENT: Positive for sore throat. Negative for ear pain, congestion, drooling, trouble swallowing and ear discharge.    Respiratory: Negative for cough, shortness of breath and stridor.    Gastrointestinal: Negative for vomiting and diarrhea.   Neurological: Negative for headaches.   All other systems reviewed and are negative.        Filed Vitals:    12/20/10 0859   BP: 125/84   Pulse: 78   Temp: 98.2 ??F (36.8 ??C)   Resp: 16   Height: 5\' 11"  (1.803 m)   Weight: 207 lb (93.895 kg)   SpO2: 99%            Physical Exam    Nursing note and vitals reviewed.  Constitutional: He is oriented to person, place, and time. He appears well-developed and well-nourished. No distress.   HENT:   Head: Normocephalic and atraumatic.   Right Ear: External ear normal.   Left Ear: External ear normal.   Nose: Nose normal.   Mouth/Throat: No oropharyngeal exudate.        Inflamed palatine tonsils.  Uvula midline.   Eyes: Conjunctivae and EOM are normal. Pupils are equal, round, and reactive to light.   Neck: Normal range of motion. Neck supple. No tracheal deviation present. No thyromegaly present.   Pulmonary/Chest: No stridor.   Lymphadenopathy:     He has cervical adenopathy.   Neurological: He is alert and oriented to person, place, and time.   Skin: Skin is warm and dry. He is not diaphoretic.   Psychiatric: He has a normal mood  and affect. His behavior is normal.        MDM     Risk of Significant Complications, Morbidity, and/or Mortality:   Presenting problems:  Low  Management options:  Low  Progress:   Patient progress:  Stable      Procedures

## 2010-12-20 NOTE — ED Provider Notes (Signed)
Patient is a 30 y.o. male presenting with sore throat and ear pain.   Sore Throat   Associated symptoms include ear pain.   Ear Pain   Associated symptoms include sore throat.        Past Medical History   Diagnosis Date   ??? Psychiatric disorder      PTSD   ??? Other ill-defined conditions      torn labrum        No past surgical history on file.      No family history on file.     History     Social History   ??? Marital Status: Single     Spouse Name: N/A     Number of Children: N/A   ??? Years of Education: N/A     Occupational History   ??? Not on file.     Social History Main Topics   ??? Smoking status: Never Smoker    ??? Smokeless tobacco: Current User   ??? Alcohol Use: No   ??? Drug Use: No   ??? Sexually Active: Yes -- Male partner(s)     Birth Control/ Protection: Pill     Other Topics Concern   ??? Not on file     Social History Narrative   ??? No narrative on file                  ALLERGIES: Erythromycin and Penicillins      Review of Systems   HENT: Positive for ear pain and sore throat.        Filed Vitals:    08/15/09 2044 08/16/09 0052   BP: 140/86 140/96   Pulse: 90 70   Temp: 98.4 ??F (36.9 ??C)    Resp: 18 20   Height: 5\' 11"  (1.803 m)    Weight: 220 lb (99.791 kg)    SpO2: 98%             Physical Exam     MDM    Procedures    States cannot afford Clindamycin.  States can take Amoxil w/o adverse reaction.

## 2010-12-20 NOTE — ED Notes (Signed)
Copy of discharge instructions given to patient. Denies any questions at this time.

## 2011-02-25 MED ORDER — AMOXICILLIN 500 MG TABLET
500 mg | ORAL_TABLET | Freq: Three times a day (TID) | ORAL | Status: AC
Start: 2011-02-25 — End: 2011-03-07

## 2011-02-25 MED ORDER — CETIRIZINE 10 MG TAB
10 mg | ORAL_TABLET | Freq: Every day | ORAL | Status: AC
Start: 2011-02-25 — End: 2011-03-27

## 2011-02-25 MED ORDER — ANTIPYRINE-BENZOCAINE 5.5 %-1.4 % EAR DROPS
Freq: Four times a day (QID) | OTIC | Status: DC
Start: 2011-02-25 — End: 2011-04-24

## 2011-02-25 NOTE — ED Notes (Signed)
Discussed PCN allergy. Patient denies that he has ever had a reaction to PCNs.

## 2011-02-25 NOTE — ED Provider Notes (Signed)
HPI Comments: 30 yo male c/o right sided ear pain that started yesterday. Patient states that he gets recurrent ear infections. Denies F/N/V/D/C or rhinorrhea. Patient also states that he had some discharge from the ear today and has also had a cough and itchy throat. Patient in NAD on exam.      Pmhx: PTSD  Shx:     Patient is a 30 y.o. male presenting with ear pain. The history is provided by the patient.   Ear Pain   This is a new problem. The current episode started yesterday. The problem occurs constantly. The problem has been gradually worsening. Patient complains that the right ear is affected.  There has been no fever. The pain is moderate. Associated symptoms include ear discharge, sore throat and cough. Pertinent negatives include no headaches, no hearing loss, no rhinorrhea, no abdominal pain, no diarrhea, no vomiting, no neck pain and no rash. His past medical history is significant for chronic ear infection.        Past Medical History   Diagnosis Date   ??? Psychiatric disorder      PTSD   ??? Other ill-defined conditions      torn labrum        No past surgical history on file.      No family history on file.     History     Social History   ??? Marital Status: Married     Spouse Name: N/A     Number of Children: N/A   ??? Years of Education: N/A     Occupational History   ??? Not on file.     Social History Main Topics   ??? Smoking status: Never Smoker    ??? Smokeless tobacco: Current User   ??? Alcohol Use: No   ??? Drug Use: No   ??? Sexually Active: Yes -- Male partner(s)     Birth Control/ Protection: Pill     Other Topics Concern   ??? Not on file     Social History Narrative   ??? No narrative on file                  ALLERGIES: Erythromycin and Penicillins      Review of Systems   Constitutional: Negative for fever, chills, diaphoresis, activity change, appetite change, fatigue and unexpected weight change.    HENT: Positive for ear pain, sore throat and ear discharge. Negative for hearing loss, nosebleeds, congestion, rhinorrhea, trouble swallowing, neck pain and postnasal drip.    Respiratory: Positive for cough. Negative for apnea, choking, chest tightness, shortness of breath, wheezing and stridor.    Cardiovascular: Negative for chest pain, palpitations and leg swelling.   Gastrointestinal: Negative for nausea, vomiting, abdominal pain, diarrhea and constipation.   Musculoskeletal: Negative for myalgias, back pain, joint swelling, arthralgias and gait problem.   Skin: Negative for color change, pallor, rash and wound.   Neurological: Negative for headaches.   Psychiatric/Behavioral: Negative for behavioral problems, confusion and agitation.       Filed Vitals:    02/25/11 1643   BP: 141/84   Pulse: 67   Temp: 98.2 ??F (36.8 ??C)   Resp: 15   Height: 5\' 11"  (1.803 m)   Weight: 215 lb (97.523 kg)   SpO2: 99%            Physical Exam   [nursing notereviewed.  Constitutional: He is oriented to person, place, and time. Vital signs are normal. He appears well-developed and  well-nourished.  Non-toxic appearance. He does not have a sickly appearance. He does not appear ill. No distress.   HENT:   Head: Normocephalic and atraumatic.   Right Ear: Hearing, tympanic membrane, external ear and ear canal normal.   Left Ear: Hearing, tympanic membrane, external ear and ear canal normal.   Nose: Nose normal.   Mouth/Throat: Uvula is midline and oropharynx is clear and moist.        Some increased pressure behind the right TM. No discharge or erythema noted within the ear canal or TM.    Eyes: Conjunctivae and EOM are normal. Pupils are equal, round, and reactive to light.   Neck: Normal range of motion. Neck supple.   Cardiovascular: Normal rate, regular rhythm, S1 normal, S2 normal and normal heart sounds.  Exam reveals no gallop.    No murmur heard.   Pulmonary/Chest: Effort normal and breath sounds normal. No respiratory distress. He has no decreased breath sounds. He has no wheezes.   Neurological: He is alert and oriented to person, place, and time.   Skin: Skin is warm, dry and intact. No abrasion and no rash noted. He is not diaphoretic. No pallor.   Psychiatric: He has a normal mood and affect. His speech is normal and behavior is normal. Judgment and thought content normal.        MDM     Risk of Significant Complications, Morbidity, and/or Mortality:   Presenting problems:  [low  Diagnostic procedures:  [low  Management options:  [low      Procedures    I have discussed the results of labs, procedures, radiographs, treatments as well as any previous results found within the [facility] [employer] [facility] with the patient and available family.?? A treatment plan was developed in conjunction with the patient and was agreed upon. The patient is ready for discharge at this time.?? All voiced understanding of the discharge plan and medication instructions or changes as appropriate.?? Questions about treatment in the ED were answered.?? The patient was encouraged to return should symptoms worsen or new problems develop. A follow up physician was provided to the patient on the discharge papers.

## 2011-02-25 NOTE — ED Notes (Signed)
I have reviewed discharge instructions with the patient.  The patient verbalized understanding.  Instructed to take antibiotic until gone. Prescriptions given and instructions covered for medications.

## 2011-04-24 MED ORDER — DOXYCYCLINE HYCLATE 100 MG TAB
100 mg | ORAL_TABLET | Freq: Two times a day (BID) | ORAL | Status: AC
Start: 2011-04-24 — End: 2011-05-04

## 2011-04-24 MED ORDER — HYDROCODONE-ACETAMINOPHEN 5 MG-325 MG TAB
5-325 mg | ORAL_TABLET | Freq: Four times a day (QID) | ORAL | Status: DC | PRN
Start: 2011-04-24 — End: 2011-10-18

## 2011-04-24 NOTE — ED Notes (Signed)
Copy of discharge instructions given to patient. Denies any questions at this time.

## 2011-04-24 NOTE — ED Provider Notes (Signed)
Patient is a 30 y.o. male presenting with dental problem. The history is provided by the patient.   Dental Pain   This is a new problem. The current episode started 2 days ago. The problem occurs constantly. The problem has not changed since onset.The pain is located in the left upper mouth.The quality of the pain is aching, throbbing and constant.  The pain is at a severity of 8/10. The pain is moderate. There was no vomiting, no nausea, no fever, no swelling, no chest pain, no shortness of breath, no headaches, no gum redness and no drainage. He has tried acetaminophen for the symptoms. The treatment provided mild relief.        Past Medical History   Diagnosis Date   ??? Psychiatric disorder      PTSD   ??? Other ill-defined conditions      torn labrum        No past surgical history on file.      No family history on file.     History     Social History   ??? Marital Status: Married     Spouse Name: N/A     Number of Children: N/A   ??? Years of Education: N/A     Occupational History   ??? Not on file.     Social History Main Topics   ??? Smoking status: Never Smoker    ??? Smokeless tobacco: Current User   ??? Alcohol Use: Yes      rare   ??? Drug Use: No   ??? Sexually Active: Yes -- Male partner(s)     Birth Control/ Protection: Pill     Other Topics Concern   ??? Not on file     Social History Narrative   ??? No narrative on file                  ALLERGIES: Erythromycin; Penicillins; and Toradol      Review of Systems   Constitutional: Negative.  Negative for fever and chills.   HENT: Positive for dental problem. Negative for ear pain, congestion, sore throat, rhinorrhea, sneezing, drooling, mouth sores, trouble swallowing, neck pain, neck stiffness, voice change, postnasal drip and sinus pressure.    Eyes: Negative.  Negative for pain and redness.   Respiratory: Negative.  Negative for cough, chest tightness and shortness of breath.    Cardiovascular: Negative.  Negative for chest pain, palpitations and leg swelling.    Gastrointestinal: Negative.  Negative for nausea, vomiting, abdominal pain, diarrhea and constipation.   Genitourinary: Negative.  Negative for dysuria and hematuria.   Musculoskeletal: Negative.  Negative for myalgias and arthralgias.   Skin: Negative.  Negative for rash.   Neurological: Negative.  Negative for dizziness, light-headedness and headaches.   Psychiatric/Behavioral: Negative.    All other systems reviewed and are negative.        Filed Vitals:    04/24/11 1814   BP: 135/88   Pulse: 68   Temp: 98 ??F (36.7 ??C)   Resp: 18   Height: 5\' 11"  (1.803 m)   Weight: 97.523 kg (215 lb)   SpO2: 97%            Physical Exam   Nursing note and vitals reviewed.  Constitutional: He is oriented to person, place, and time. He appears well-developed and well-nourished. No distress.   HENT:   Head: Atraumatic.   Mouth/Throat: Uvula is midline, oropharynx is clear and moist and mucous membranes are normal.  Eyes: Conjunctivae are normal.   Neck: Normal range of motion. Neck supple.   Cardiovascular: Normal rate, regular rhythm, normal heart sounds and intact distal pulses.    Pulmonary/Chest: Effort normal and breath sounds normal. No respiratory distress.   Abdominal: Soft. Bowel sounds are normal. There is no tenderness.   Musculoskeletal: Normal range of motion.   Neurological: He is alert and oriented to person, place, and time. He has normal reflexes.   Skin: Skin is warm and dry. He is not diaphoretic.   Psychiatric: He has a normal mood and affect. His behavior is normal. Judgment and thought content normal.        MDM     Amount and/or Complexity of Data Reviewed:   Discussion of test results with the performing providers:  No   Decide to obtain previous medical records or to obtain history from someone other than the patient:  No   Obtain history from someone other than the patient:  No   Review and summarize past medical records:  No   Discuss the patient with another provider:  No    Independant visualization of image, tracing, or specimen:  No  Risk of Significant Complications, Morbidity, and/or Mortality:   Presenting problems:  Low  Diagnostic procedures:  Low  Management options:  Low  Progress:   Patient progress:  Stable      Procedures

## 2011-04-24 NOTE — ED Notes (Signed)
Pt presents to ER for pain to L upper molars x 2 days, he reports waking this am w/ sensation that something may be "stuck" in between teeth.

## 2011-09-14 NOTE — ED Notes (Signed)
Pt 2nd call for triage; no answer

## 2011-09-14 NOTE — ED Notes (Signed)
No answer when called for triage.

## 2011-10-18 MED ORDER — LORAZEPAM 1 MG TAB
1 mg | ORAL | Status: AC
Start: 2011-10-18 — End: 2011-10-18
  Administered 2011-10-18: 12:00:00 via ORAL

## 2011-10-18 MED ORDER — OXYCODONE-ACETAMINOPHEN 5 MG-325 MG TAB
5-325 mg | ORAL | Status: AC
Start: 2011-10-18 — End: 2011-10-18
  Administered 2011-10-18: 12:00:00 via ORAL

## 2011-10-18 MED ORDER — METHYLPREDNISOLONE 4 MG TABS IN A DOSE PACK
4 mg | PACK | ORAL | Status: DC
Start: 2011-10-18 — End: 2012-03-14

## 2011-10-18 MED ORDER — DEXAMETHASONE SODIUM PHOSPHATE 10 MG/ML IJ SOLN
10 mg/mL | INTRAMUSCULAR | Status: AC
Start: 2011-10-18 — End: 2011-10-18
  Administered 2011-10-18: 12:00:00 via INTRAMUSCULAR

## 2011-10-18 NOTE — ED Provider Notes (Signed)
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 Also c/o numbness entire hand.    Patient is a 30 y.o. male presenting with hand pain. The history is provided by the patient.   Hand Pain   This is a new problem. The current episode started more than 1 week ago. The problem occurs constantly. The problem has been gradually worsening. The pain is present in the left wrist, left hand, right wrist and right hand. The quality of the pain is described as aching. The pain is at a severity of 9/10. The pain is severe. Associated symptoms include numbness, limited range of motion, stiffness and tingling. The symptoms are aggravated by cold, palpation and activity. He has tried cold, rest, OTC ointments and heat for the symptoms. The treatment provided no relief. There has been no history of extremity trauma.        Past Medical History   Diagnosis Date   ??? Psychiatric disorder      PTSD   ??? Other ill-defined conditions      torn labrum        No past surgical history on file.      No family history on file.     History     Social History   ??? Marital Status: Married     Spouse Name: N/A     Number of Children: N/A   ??? Years of Education: N/A     Occupational History   ??? Not on file.     Social History Main Topics   ??? Smoking status: Never Smoker    ??? Smokeless tobacco: Current User   ??? Alcohol Use: Yes      rare   ??? Drug Use: No   ??? Sexually Active: Yes -- Male partner(s)     Birth Control/ Protection: Pill     Other Topics Concern   ??? Not on file     Social History Narrative   ??? No narrative on file                  ALLERGIES: Erythromycin; Penicillins; and Toradol      Review of Systems   Constitutional: Negative.  Negative for activity change.   HENT: Negative.    Eyes: Negative.    Respiratory: Negative.    Cardiovascular: Negative.    Gastrointestinal: Negative.    Genitourinary: Negative.    Musculoskeletal: Positive for stiffness.   Skin: Negative.     Neurological: Positive for tingling and numbness.   Hematological: Negative.    Psychiatric/Behavioral: Negative.    All other systems reviewed and are negative.        Filed Vitals:    10/18/11 0555   BP: 136/86   Pulse: 67   Temp: 98 ??F (36.7 ??C)   Resp: 20   Height: 5\' 11"  (1.803 m)   Weight: 97.523 kg (215 lb)   SpO2: 99%            Physical Exam   Nursing note and vitals reviewed.  Constitutional: He is oriented to person, place, and time. He appears well-developed and well-nourished.   HENT:   Head: Normocephalic and atraumatic.   Right Ear: External ear normal.   Left Ear: External ear normal.   Eyes: Conjunctivae and EOM are normal. Pupils are equal, round, and reactive to light.   Neck: Normal range of motion. Neck supple.   Cardiovascular: Normal rate, regular rhythm  and intact distal pulses.    Pulmonary/Chest: Effort normal and breath sounds normal.   Abdominal: Soft. Bowel sounds are normal.   Musculoskeletal: He exhibits edema and tenderness.        Right wrist: He exhibits tenderness and swelling.        Left wrist: He exhibits tenderness and swelling.        Arms:  Neurological: He is alert and oriented to person, place, and time. No cranial nerve deficit.   Skin: Skin is warm and dry.   Psychiatric: He has a normal mood and affect.        MDM     Risk of Significant Complications, Morbidity, and/or Mortality:   Presenting problems:  Minimal  Diagnostic procedures:  Minimal  Management options:  Minimal  Progress:   Patient progress:  Stable      Procedures

## 2011-10-18 NOTE — ED Notes (Signed)
I have reviewed discharge instructions with the patient.  The patient verbalized understanding. prescription given to pt. Pt ambulatory. Pt's has someone to drive him. Pt not in any distress.

## 2011-10-18 NOTE — ED Notes (Signed)
C/o bilateral hand pain. Onset couple weeks ago. 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 Attempted otc meds without relief

## 2012-03-14 MED ORDER — CYCLOBENZAPRINE 10 MG TAB
10 mg | ORAL_TABLET | Freq: Three times a day (TID) | ORAL | Status: DC | PRN
Start: 2012-03-14 — End: 2014-05-17

## 2012-03-14 MED ORDER — TRAMADOL 50 MG TAB
50 mg | ORAL_TABLET | Freq: Four times a day (QID) | ORAL | Status: DC | PRN
Start: 2012-03-14 — End: 2012-04-21
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
## 2012-03-14 NOTE — ED Notes (Signed)
I have reviewed discharge instructions with the patient.  The patient verbalized understanding.  Pt education for medication including not to drink alcohol, drive, work, or make important decisions given.  Pt verbalized understanding of teaching.   No questions when given opportunity to ask.  Prescriptions x2 given.  Ambulatory out of ED.
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
## 2012-04-21 LAB — STREP AG SCREEN, GROUP A: Group A Strep Ag ID: NEGATIVE

## 2012-04-21 MED ORDER — CLINDAMYCIN 150 MG CAP
150 mg | ORAL_CAPSULE | Freq: Four times a day (QID) | ORAL | Status: AC
Start: 2012-04-21 — End: 2012-05-01

## 2012-04-21 NOTE — ED Provider Notes (Signed)
Patient is a 31 y.o. male presenting with sore throat. The history is provided by the patient.   Sore Throat   This is a new problem. Episode onset: this am. The problem has not changed since onset.There has been no fever. Pertinent negatives include no diarrhea, no vomiting, no congestion, no drooling, no ear discharge, no ear pain, no headaches, no plugged ear sensation, no shortness of breath, no stridor, no swollen glands, no trouble swallowing, no stiff neck and no cough. Associated symptoms comments: Left upper toothache. Treatments tried: Pain medicine for neck. The treatment provided no relief.        Past Medical History   Diagnosis Date   ??? Psychiatric disorder      PTSD   ??? Other ill-defined conditions      torn labrum   ??? PUD (peptic ulcer disease)         History reviewed. No pertinent past surgical history.      History reviewed. No pertinent family history.     History     Social History   ??? Marital Status: MARRIED     Spouse Name: N/A     Number of Children: N/A   ??? Years of Education: N/A     Occupational History   ??? Not on file.     Social History Main Topics   ??? Smoking status: Never Smoker    ??? Smokeless tobacco: Current User   ??? Alcohol Use: Yes      rare   ??? Drug Use: No   ??? Sexually Active: Yes -- Male partner(s)     Birth Control/ Protection: Pill     Other Topics Concern   ??? Not on file     Social History Narrative   ??? No narrative on file                  ALLERGIES: Erythromycin; Penicillins; and Toradol      Review of Systems   Constitutional: Negative.    HENT: Positive for sore throat and dental problem. Negative for ear pain, congestion, drooling, trouble swallowing and ear discharge.    Eyes: Negative.    Respiratory: Negative.  Negative for cough, shortness of breath and stridor.    Cardiovascular: Negative.    Gastrointestinal: Negative.  Negative for vomiting and diarrhea.   Genitourinary: Negative.    Musculoskeletal: Negative.    Skin: Negative.    Neurological: Negative.   Negative for headaches.   Hematological: Negative.    Psychiatric/Behavioral: Negative.    All other systems reviewed and are negative.        Filed Vitals:    04/21/12 1756   BP: 135/83   Pulse: 79   Temp: 98 ??F (36.7 ??C)   Resp: 12   Height: 5\' 11"  (1.803 m)   Weight: 97.523 kg (215 lb)   SpO2: 98%            Physical Exam   Nursing note and vitals reviewed.  Constitutional: He is oriented to person, place, and time. He appears well-developed and well-nourished.   HENT:   Head: Normocephalic and atraumatic. No trismus in the jaw.   Right Ear: External ear normal.   Left Ear: External ear normal.   Nose: Nose normal.   Mouth/Throat: Uvula is midline, oropharynx is clear and moist and mucous membranes are normal. He does not have dentures. No oral lesions. Abnormal dentition. Dental abscesses and dental caries present. No uvula swelling or lacerations.  Eyes: Conjunctivae and EOM are normal. Pupils are equal, round, and reactive to light.   Neck: Normal range of motion. Neck supple.   Cardiovascular: Normal rate, regular rhythm, normal heart sounds and intact distal pulses.    Pulmonary/Chest: Effort normal and breath sounds normal.   Abdominal: Soft. Bowel sounds are normal.   Musculoskeletal: Normal range of motion.   Neurological: He is alert and oriented to person, place, and time. He has normal reflexes.   Skin: Skin is warm and dry.   Psychiatric: He has a normal mood and affect. His behavior is normal. Judgment and thought content normal.        MDM    Procedures    The patient was observed in the ED.    I discussed the results of all labs, procedures, radiographs, and treatments with the patient and available family.  Treatment plan is agreed upon and the patient is ready for discharge.  All voiced understanding of the discharge plan and medication instructions or changes as appropriate.  Questions about treatment in the ED were answered.  All were encouraged to return should symptoms worsen or new  problems develop.

## 2012-04-21 NOTE — ED Notes (Signed)
Pt reports left side throat pain and left facial pain/pressure.

## 2012-04-21 NOTE — ED Notes (Signed)
Copy of discharge instructions given to patient. Denies any questions at this time.

## 2012-04-25 LAB — CULTURE, STREP THROAT

## 2012-04-25 NOTE — Progress Notes (Signed)
Quick Note:    Placed on clindamycin, continue current antibiotics.  ______

## 2014-01-28 NOTE — Telephone Encounter (Signed)
Patient called requesting refill on Norco 10mg . States he will run out by Saturday. Currently being treated by Dr Beather [place] for Shoulder Pain and Carpel Tunnel. Has appt on 02/06/14. Please advise

## 2014-01-31 MED ORDER — HYDROCODONE-ACETAMINOPHEN 10 MG-325 MG TAB
10-325 mg | ORAL_TABLET | Freq: Four times a day (QID) | ORAL | Status: DC | PRN
Start: 2014-01-31 — End: 2014-02-08

## 2014-02-08 DIAGNOSIS — M25519 Pain in unspecified shoulder: Secondary | ICD-10-CM | POA: Insufficient documentation

## 2014-02-08 DIAGNOSIS — G5600 Carpal tunnel syndrome, unspecified upper limb: Secondary | ICD-10-CM | POA: Insufficient documentation

## 2014-02-08 DIAGNOSIS — E669 Obesity, unspecified: Secondary | ICD-10-CM | POA: Insufficient documentation

## 2014-02-08 MED ORDER — HYDROCODONE-ACETAMINOPHEN 10 MG-325 MG TAB
10-325 mg | ORAL_TABLET | Freq: Four times a day (QID) | ORAL | Status: DC | PRN
Start: 2014-02-08 — End: 2014-03-27

## 2014-02-08 MED ORDER — IBUPROFEN 800 MG TAB
800 mg | ORAL_TABLET | Freq: Three times a day (TID) | ORAL | Status: DC | PRN
Start: 2014-02-08 — End: 2014-05-17

## 2014-02-08 MED ORDER — ALPRAZOLAM 2 MG TAB
2 mg | ORAL_TABLET | Freq: Four times a day (QID) | ORAL | Status: DC
Start: 2014-02-08 — End: 2014-08-05

## 2014-02-08 NOTE — Progress Notes (Signed)
Shane Wagner is a 33 y.o. WHITE OR CAUCASIAN male.    BP looks good. He has been exercising regularly, eating healthy and has lost 12 lbs!!!  Having a bad day with PTSD. He is only taking the Paxil 1/2 tab every other day.  He needs NCS and referral to a hand specialist    Past Medical History   Diagnosis Date   ??? Psychiatric disorder      PTSD   ??? Other ill-defined conditions(799.89)      torn labrum   ??? PUD (peptic ulcer disease)    ??? Carpal tunnel syndrome 02/08/2014   ??? Obesity 02/08/2014   ??? Panic disorder 02/08/2014   ??? PTSD (post-traumatic stress disorder) 02/08/2014   ??? Arthralgia of shoulder 02/08/2014     No past surgical history on file.  History     Social History   ??? Marital Status: MARRIED     Spouse Name: N/A     Number of Children: N/A   ??? Years of Education: N/A     Occupational History   ??? 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 Smoking status: Never Smoker    ??? Smokeless tobacco: Current User      Comment: dip tobacco   ??? Alcohol Use: No      Comment: rare   ??? Drug Use: No   ??? Sexual Activity:     Partners: Female     Pharmacist, hospitalBirth Control/ Protection: Pill     Other Topics Concern   ??? Not on file     Social History Narrative     Family History   Problem Relation Age of Onset   ??? Other Mother      addicted to prescription pain med   ??? Other Father      cirrhosis     Current Outpatient Prescriptions   Medication Sig Dispense Refill   ??? PARoxetine CR (PAXIL CR) 12.5 mg tablet Take 12.5 mg by mouth daily.       ??? ALPRAZolam (XANAX) 2 mg tablet Take 1 Tab by mouth four (4) times daily. Max Daily Amount: 8 mg.  120 Tab  3   ??? HYDROcodone-acetaminophen (NORCO) 10-325 mg tablet Take 1 Tab by mouth every six (6) hours as needed. Max Daily Amount: 4 Tabs.  120 Tab  0   ??? cyclobenzaprine (FLEXERIL) 10 mg tablet Take 1 Tab by mouth three (3) times daily as needed for Muscle Spasm(s).  12 Tab  0     Allergies   Allergen Reactions   ??? Erythromycin Diarrhea and Nausea and Vomiting   ??? Penicillins  Nausea and Vomiting     States not allergic to pcn   ??? Toradol [Ketorolac Tromethamine] Shortness of Breath       Review of Systems   Constitutional: Positive for weight loss.   HENT: Negative.    Eyes: Negative for blurred vision, pain and redness.   Respiratory: Negative.    Cardiovascular: Negative.    Gastrointestinal: Negative.    Genitourinary: Negative.    Musculoskeletal: Positive for joint pain.   Skin: Negative for rash.   Neurological: Negative for dizziness, sensory change and focal weakness.        Bilat hand pain/CTS   Endo/Heme/Allergies: Negative.    Psychiatric/Behavioral: Negative for depression, suicidal ideas and memory loss. The patient is not nervous/anxious and does not have insomnia.  Ptsd       BP 118/76    Pulse 82    Temp(Src) 98.1 ??F (36.7 ??C) (Oral)    Resp 18    Ht 5\' 10"  (1.778 m)    Wt 227 lb (102.967 kg)    BMI 32.57 kg/m2      SpO2 98%     Physical Exam   Constitutional: He is oriented to person, place, and time and well-developed, well-nourished, and in no distress.   HENT:   Head: Normocephalic and atraumatic.   Right Ear: External ear normal.   Left Ear: External ear normal.   Mouth/Throat: Oropharynx is clear and moist.   Eyes: Conjunctivae and EOM are normal. Pupils are equal, round, and reactive to light.   Neck: Normal range of motion. Neck supple. No JVD present. No tracheal deviation present. No thyromegaly present.   Cardiovascular: Normal rate, regular rhythm, normal heart sounds and intact distal pulses.    Pulmonary/Chest: Effort normal and breath sounds normal.   Abdominal: Soft. Bowel sounds are normal. He exhibits no mass. There is no tenderness.   No hepatosplenomegaly   Musculoskeletal: Normal range of motion. He exhibits no edema.   Lymphadenopathy:     He has no cervical adenopathy.   Neurological: He is alert and oriented to person, place, and time. Gait normal.   Tinel's sign pos   Skin: Skin is warm and dry. No rash noted.   Psychiatric: Mood, memory,  affect and judgment normal.       Assessment and Plan    1. Bilateral carpal tunnel syndrome  REFER TO [facility] FOR NCS AND TO SEE THE HAND SPECIALIST  - REFERRAL TO ORTHOPEDICS  - REFERRAL TO ORTHOPEDICS    2. Obesity  GREAT JOB ON WEIGHT LOSS!!! CONTINUE EXERCISE AND HEALTHY EATING    3. Panic disorder  CONTINUE XANAX AND PAXIL. HE NEEDS TO SEE A PSYCHIATRIST    4. PTSD (post-traumatic stress disorder)    - REFERRAL TO PSYCHIATRY    5. Arthralgia of shoulder, right  HE WANTS TO HOLD OFF ON SURGERY UNTIL HIS HANDS HAVE BEEN ADDRESSED. HE IS CONCERNED THAT HE MIGHT NEED TO BE OUT OF WORK TOO LONG IF HE HAS SHOULDER SURGERY

## 2014-03-27 MED ORDER — HYDROCODONE-ACETAMINOPHEN 10 MG-325 MG TAB
10-325 mg | ORAL_TABLET | Freq: Four times a day (QID) | ORAL | Status: DC | PRN
Start: 2014-03-27 — End: 2014-04-29

## 2014-03-27 NOTE — Telephone Encounter (Signed)
Patient notified that prescription is at front desk ready to be picked up.

## 2014-03-27 NOTE — Telephone Encounter (Signed)
Patient called requesting refill on Norco Rx

## 2014-04-29 MED ORDER — HYDROCODONE-ACETAMINOPHEN 10 MG-325 MG TAB
10-325 mg | ORAL_TABLET | Freq: Four times a day (QID) | ORAL | Status: DC | PRN
Start: 2014-04-29 — End: 2014-05-30

## 2014-04-29 NOTE — Telephone Encounter (Signed)
Patient called requesting refill on Norco Rx

## 2014-04-30 NOTE — Telephone Encounter (Signed)
Patient informed prescription is at the front desk to be picked up. Patient verbalizes understanding

## 2014-05-17 NOTE — Progress Notes (Signed)
Shane Wagner is a 33 y.o. WHITE OR CAUC[place] male.    He is concerned about the possibility of low testosterone. He doesn't have any problem getting an erection. However, he is concerned about some relationship issues and decreased libido with his wife.   He also continues to have PTSD problems and is easily irritated.   Never heard from Ortho.  Continues to work on weight loss and is exercising regularly.    Past Medical History   Diagnosis Date   ??? Psychiatric disorder      PTSD   ??? Other ill-defined conditions(799.89)      torn labrum   ??? PUD (peptic ulcer disease)    ??? Carpal tunnel syndrome 02/08/2014   ??? Obesity 02/08/2014   ??? Panic disorder 02/08/2014   ??? PTSD (post-traumatic stress disorder) 02/08/2014   ??? Arthralgia of shoulder 02/08/2014     History reviewed. No pertinent past surgical history.  History     Social History   ??? Marital Status: MARRIED     Spouse Name: N/A     Number of Children: N/A   ??? Years of Education: N/A     Occupational History   ??? 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 Smoking status: Never Smoker    ??? Smokeless tobacco: Current User      Comment: dip tobacco   ??? Alcohol Use: No      Comment: rare   ??? Drug Use: No   ??? Sexual Activity:     Partners: Female     Pharmacist, hospitalBirth Control/ Protection: Pill     Other Topics Concern   ??? Not on file     Social History Narrative     Family History   Problem Relation Age of Onset   ??? Other Mother      addicted to prescription pain med   ??? Other Father      cirrhosis     Current Outpatient Prescriptions   Medication Sig Dispense Refill   ??? HYDROcodone-acetaminophen (NORCO) 10-325 mg tablet Take 1 Tab by mouth every six (6) hours as needed. Max Daily Amount: 4 Tabs. 120 Tab 0   ??? PARoxetine CR (PAXIL CR) 12.5 mg tablet Take 12.5 mg by mouth daily.     ??? ALPRAZolam (XANAX) 2 mg tablet Take 1 Tab by mouth four (4) times daily. Max Daily Amount: 8 mg. 120 Tab 3     Allergies   Allergen Reactions    ??? Erythromycin Diarrhea and Nausea and Vomiting   ??? Penicillins Nausea and Vomiting     States not allergic to pcn   ??? Toradol [Ketorolac Tromethamine] Shortness of Breath       Review of Systems   Constitutional: Positive for weight loss.   HENT: Negative.    Eyes: Negative for blurred vision, pain and redness.   Respiratory: Negative.    Cardiovascular: Negative.    Gastrointestinal: Negative.    Genitourinary: Negative.    Musculoskeletal: Positive for joint pain.   Skin: Negative for rash.   Neurological: Negative for dizziness, sensory change and focal weakness.        Bilat hand pain/CTS   Endo/Heme/Allergies: Negative.    Psychiatric/Behavioral: Negative for depression, suicidal ideas and memory loss. The patient is not nervous/anxious and does not have insomnia.         Ptsd       BP 116/86 mmHg   Pulse 62  Temp(Src) 98.3 ??F (36.8 ??C) (Oral)   Resp 18   Ht 5\' 10"  (1.778 m)   Wt 223 lb 6.4 oz (101.334 kg)   BMI 32.05 kg/m2   SpO2 99%    Physical Exam   Constitutional: He is oriented to person, place, and time and well-developed, well-nourished, and in no distress.   HENT:   Head: Normocephalic and atraumatic.   Neurological: He is alert and oriented to person, place, and time. Gait normal.   Tinel's sign pos   Skin: Skin is warm and dry. No rash noted.   Psychiatric: Mood, memory, affect and judgment normal.       Assessment and Plan    1. Bilateral carpal tunnel syndrome  WILL CHECK ON ORTHO REFERRAL     2. Obesity  GREAT JOB ON WEIGHT LOSS!!! CONTINUE EXERCISE AND HEALTHY EATING    3. Panic disorder  CONTINUE XANAX AND PAXIL. WILL TRY TO GET HIM IN TO SEE COUNSELOR THAT WILL BE COMING TO THIS OFFICE    4. PTSD (post-traumatic stress disorder)  HE REALLY NEEDS TO SEE A PSYCHIATRIST; WILL TRY TO GET HIM IN TO SEE COUNSELOR    5. Arthralgia of shoulder, right  HE WANTS TO HOLD OFF ON SURGERY UNTIL HIS HANDS HAVE BEEN ADDRESSED. HE IS CONCERNED THAT HE MIGHT NEED TO BE OUT OF WORK TOO LONG IF HE HAS SHOULDER  SURGERY

## 2014-05-30 NOTE — Telephone Encounter (Signed)
Pt called requesting refill on Norco Rx

## 2014-05-31 MED ORDER — HYDROCODONE-ACETAMINOPHEN 10 MG-325 MG TAB
10-325 mg | ORAL_TABLET | Freq: Four times a day (QID) | ORAL | Status: DC | PRN
Start: 2014-05-31 — End: 2014-06-28

## 2014-05-31 NOTE — Telephone Encounter (Signed)
Attempted to notify pt that Rx is ready to be picked up. Pt's vm has not been set up yet

## 2014-06-28 MED ORDER — HYDROCODONE-ACETAMINOPHEN 10 MG-325 MG TAB
10-325 mg | ORAL_TABLET | Freq: Four times a day (QID) | ORAL | Status: DC | PRN
Start: 2014-06-28 — End: 2014-08-05

## 2014-06-28 NOTE — Telephone Encounter (Signed)
Pt notified that Rx is ready to be picked up.

## 2014-06-28 NOTE — Telephone Encounter (Signed)
Pt called requesting refill on Norco Rx.

## 2014-08-05 MED ORDER — HYDROCODONE-ACETAMINOPHEN 10 MG-325 MG TAB
10-325 mg | ORAL_TABLET | Freq: Four times a day (QID) | ORAL | Status: DC | PRN
Start: 2014-08-05 — End: 2014-08-30

## 2014-08-05 MED ORDER — ALPRAZOLAM 2 MG TAB
2 mg | ORAL_TABLET | Freq: Four times a day (QID) | ORAL | Status: DC
Start: 2014-08-05 — End: 2014-12-16

## 2014-08-05 NOTE — Telephone Encounter (Signed)
Pt called requesting refill on Xanax and Norco

## 2014-08-07 NOTE — Telephone Encounter (Signed)
Pt notified that Rx is ready to be picked up.

## 2014-08-26 ENCOUNTER — Encounter: Attending: Internal Medicine | Primary: Internal Medicine

## 2014-08-30 MED ORDER — HYDROCODONE-ACETAMINOPHEN 10 MG-325 MG TAB
10-325 mg | ORAL_TABLET | Freq: Four times a day (QID) | ORAL | Status: DC | PRN
Start: 2014-08-30 — End: 2014-10-04

## 2014-08-30 NOTE — Telephone Encounter (Signed)
Pt called requesting refill on Norco rx

## 2014-09-03 NOTE — Telephone Encounter (Signed)
Pt notified that Rx is ready to be picked up.

## 2014-09-06 NOTE — Progress Notes (Signed)
Pt left vm to please call. Attempted to return call. No answer. Voicemail has not been set up yet.

## 2014-10-04 MED ORDER — HYDROCODONE-ACETAMINOPHEN 10 MG-325 MG TAB
10-325 mg | ORAL_TABLET | Freq: Four times a day (QID) | ORAL | Status: DC | PRN
Start: 2014-10-04 — End: 2015-01-23

## 2014-10-04 NOTE — Telephone Encounter (Signed)
Pt notified that Rx is ready to be picked up.

## 2014-10-04 NOTE — Telephone Encounter (Signed)
Pt called requesting refill on Norco Rx

## 2014-10-07 NOTE — Progress Notes (Signed)
[facility] pharmacy in [place] called and stated that pt requested a 12 day early refill on Xanax. Spoke with Dr. Beather ArbourMalvern. Returned call to pharmacy that this refill request was denied.

## 2014-10-14 ENCOUNTER — Encounter: Attending: Internal Medicine | Primary: Internal Medicine

## 2014-12-16 MED ORDER — ALPRAZOLAM 2 MG TAB
2 mg | ORAL_TABLET | ORAL | Status: DC
Start: 2014-12-16 — End: 2014-12-17

## 2014-12-17 MED ORDER — ALPRAZOLAM 2 MG TAB
2 mg | ORAL_TABLET | ORAL | Status: DC
Start: 2014-12-17 — End: 2015-01-23

## 2015-01-23 NOTE — Telephone Encounter (Signed)
Patient called requesting refill

## 2015-01-27 MED ORDER — HYDROCODONE-ACETAMINOPHEN 10 MG-325 MG TAB
10-325 mg | ORAL_TABLET | Freq: Four times a day (QID) | ORAL | Status: DC | PRN
Start: 2015-01-27 — End: 2015-02-21

## 2015-01-27 MED ORDER — ALPRAZOLAM 2 MG TAB
2 mg | ORAL_TABLET | ORAL | Status: DC
Start: 2015-01-27 — End: 2015-02-21

## 2015-01-27 NOTE — Telephone Encounter (Signed)
I will write for these scripts today but he needs to make and keep an apt before he gets any more scripts. Thanks!

## 2015-01-27 NOTE — Telephone Encounter (Signed)
Called to inform patient Rx is ready for pick up.

## 2015-02-21 ENCOUNTER — Encounter: Attending: Internal Medicine | Primary: Internal Medicine

## 2015-02-21 ENCOUNTER — Ambulatory Visit
Admit: 2015-02-21 | Discharge: 2015-02-21 | Payer: BLUE CROSS/BLUE SHIELD | Attending: Internal Medicine | Primary: Internal Medicine

## 2015-02-21 DIAGNOSIS — M25511 Pain in right shoulder: Secondary | ICD-10-CM

## 2015-02-21 MED ORDER — HYDROCODONE-ACETAMINOPHEN 10 MG-325 MG TAB
10-325 mg | ORAL_TABLET | Freq: Four times a day (QID) | ORAL | Status: DC | PRN
Start: 2015-02-21 — End: 2015-02-21

## 2015-02-21 MED ORDER — HYDROCODONE-ACETAMINOPHEN 10 MG-325 MG TAB
10-325 mg | ORAL_TABLET | Freq: Four times a day (QID) | ORAL | Status: DC | PRN
Start: 2015-02-21 — End: 2015-05-15

## 2015-02-21 MED ORDER — ALPRAZOLAM 2 MG TAB
2 mg | ORAL_TABLET | ORAL | Status: DC
Start: 2015-02-21 — End: 2015-05-15
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
## 2015-03-10 NOTE — Telephone Encounter (Signed)
[facility] [place] called and wanted to verify a Rx for alprazolam # 120 written on  01/27/15, patients [facility] shows that patient recently filled this medication on 02/21/15, following discussion with Dr. Beather ArbourMalvern this Rx needs to be voided because patient filled Rx on 02/21/15 that has 3 refills and the Rx from 01/27/15 the patient doesn't need.

## 2015-05-15 ENCOUNTER — Encounter

## 2015-05-15 NOTE — Telephone Encounter (Signed)
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

## 2015-05-16 MED ORDER — HYDROCODONE-ACETAMINOPHEN 10 MG-325 MG TAB
10-325 mg | ORAL_TABLET | Freq: Four times a day (QID) | ORAL | Status: DC | PRN
Start: 2015-05-16 — End: 2015-05-23

## 2015-05-16 MED ORDER — ALPRAZOLAM 2 MG TAB
2 mg | ORAL_TABLET | ORAL | Status: DC
Start: 2015-05-16 — End: 2015-08-29

## 2015-05-16 NOTE — Telephone Encounter (Signed)
Called several times unable to leave VM, because it is not set up. Will leave Rx at front desk for pick up. Ok for patient to reschedule upcoming appointment.

## 2015-05-16 NOTE — Telephone Encounter (Signed)
Called and informed patient Rx is ready for pick up.

## 2015-05-23 ENCOUNTER — Ambulatory Visit
Admit: 2015-05-23 | Discharge: 2015-05-23 | Payer: BLUE CROSS/BLUE SHIELD | Attending: Internal Medicine | Primary: Internal Medicine

## 2015-05-23 DIAGNOSIS — G5603 Carpal tunnel syndrome, bilateral upper limbs: Secondary | ICD-10-CM

## 2015-05-23 MED ORDER — HYDROCODONE-ACETAMINOPHEN 10 MG-325 MG TAB
10-325 mg | ORAL_TABLET | Freq: Four times a day (QID) | ORAL | Status: DC | PRN
Start: 2015-05-23 — End: 2015-05-23

## 2015-05-23 MED ORDER — HYDROCODONE-ACETAMINOPHEN 10 MG-325 MG TAB
10-325 mg | ORAL_TABLET | Freq: Four times a day (QID) | ORAL | Status: DC | PRN
Start: 2015-05-23 — End: 2015-08-29
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
## 2015-05-23 NOTE — Progress Notes (Signed)
Shane HillockKevin D Wagner is a 34 y.o. WHITE [place]UC[place] male.    Staying with his mother a good bit of the time--she has dementia and other medical problems.   Still hasn't had surgery on shoulder and jaw. He has completed PT for his shoulder and feels like he may not need surgery.  Most of his pain is in his hands--never had NCS.     Past Medical History   Diagnosis Date   ??? Psychiatric disorder      PTSD   ??? Other ill-defined conditions(799.89)      torn labrum   ??? PUD (peptic ulcer disease)    ??? Carpal tunnel syndrome 02/08/2014   ??? Obesity 02/08/2014   ??? Panic disorder 02/08/2014   ??? PTSD (post-traumatic stress disorder) 02/08/2014   ??? Arthralgia of shoulder 02/08/2014     History reviewed. No pertinent past surgical history.  History     Social History   ??? Marital Status: MARRIED     Spouse Name: N/A   ??? Number of Children: N/A   ??? Years of Education: N/A     Occupational History   ??? 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 Smoking status: Never Smoker    ??? Smokeless tobacco: Current User      Comment: dip tobacco   ??? Alcohol Use: No      Comment: rare   ??? Drug Use: No   ??? Sexual Activity:     Partners: Female     Pharmacist, hospitalBirth Control/ Protection: Pill     Other Topics Concern   ??? Not on file     Social History Narrative     Family History   Problem Relation Age of Onset   ??? Other Mother      addicted to prescription pain med   ??? Other Father      cirrhosis     Current Outpatient Prescriptions   Medication Sig Dispense Refill   ??? HYDROcodone-acetaminophen (NORCO) 10-325 mg tablet Take 1 Tab by mouth every six (6) hours as needed. Max Daily Amount: 4 Tabs. 120 Tab 0   ??? ALPRAZolam (XANAX) 2 mg tablet TAKE 1 TABLET BY MOUTH FOUR TIMES DAILY. MAXIMUM DAILY AMOUNT: 8 MG( 4 TABLETS) 120 Tab 3     Allergies   Allergen Reactions   ??? Erythromycin Diarrhea and Nausea and Vomiting   ??? Penicillins Nausea and Vomiting     States not allergic to pcn   ??? Toradol [Ketorolac Tromethamine] Shortness of Breath        Review of Systems   Constitutional: Negative.    HENT: Negative.    Eyes: Negative for blurred vision, pain and redness.   Respiratory: Negative.    Cardiovascular: Negative.    Gastrointestinal: Negative.    Genitourinary: Negative.    Musculoskeletal: Positive for joint pain.        Jaw pain   Skin: Negative for rash.   Neurological: Negative for dizziness, sensory change and focal weakness.        Bilat hand pain/CTS   Endo/Heme/Allergies: Negative.    Psychiatric/Behavioral: Negative for depression, suicidal ideas and memory loss. The patient is not nervous/anxious and does not have insomnia.         Ptsd       BP 132/71 mmHg   Pulse 62   Temp(Src) 98.2 ??F (36.8 ??C) (Oral)   Resp 18   Ht 5\' 10"  (1.778 m)  Wt 226 lb (102.513 kg)   BMI 32.43 kg/m2   SpO2 98%    Physical Exam   Constitutional: He is oriented to person, place, and time and well-developed, well-nourished, and in no distress.   HENT:   Head: Normocephalic and atraumatic.   Right Ear: External ear normal.   Left Ear: External ear normal.   Mouth/Throat: Oropharynx is clear and moist.   Poor dentition   Eyes: Conjunctivae and EOM are normal. Pupils are equal, round, and reactive to light.   Neck: Normal range of motion. Neck supple. No JVD present. No tracheal deviation present. No thyromegaly present.   Cardiovascular: Normal rate, regular rhythm and normal heart sounds.    Pulmonary/Chest: Effort normal and breath sounds normal.   Lymphadenopathy:     He has no cervical adenopathy.   Neurological: He is alert and oriented to person, place, and time. Gait normal.   Skin: Skin is warm and dry. No rash noted.   Psychiatric: Mood, memory, affect and judgment normal.       Assessment and Plan    1. Bilateral carpal tunnel syndrome  REFER TO [facility]    2. Obesity  EATING BETTER, TRYING TO INCREASE EXERCISE    3. Panic disorder  CONTINUE XANAX AND PAXIL. WILL HAVE GEORGE TALK TO HIM ABOUT COUNSELING OPTIONS    4. PTSD (post-traumatic stress disorder)   HE REALLY NEEDS TO SEE A PSYCHIATRIST BUT THIS IS ON HOLD FOR NOW    5. Arthralgia of shoulder, right  HIS SHOULDER IS ACTUALLY BETTER

## 2015-05-31 ENCOUNTER — Emergency Department (HOSPITAL_COMMUNITY)
Admission: EM | Admit: 2015-05-31 | Discharge: 2015-05-31 | Disposition: A | Discharge status: Home / Self Care | Payer: BLUE CROSS/BLUE SHIELD | Attending: Emergency Medicine | Admitting: Emergency Medicine

## 2015-05-31 ENCOUNTER — Encounter (HOSPITAL_COMMUNITY): Payer: Self-pay | Admitting: Emergency Medicine

## 2015-05-31 DIAGNOSIS — L559 Sunburn, unspecified: Secondary | ICD-10-CM | POA: Diagnosis not present

## 2015-05-31 DIAGNOSIS — M545 Low back pain: Secondary | ICD-10-CM | POA: Insufficient documentation

## 2015-05-31 DIAGNOSIS — R1033 Periumbilical pain: Secondary | ICD-10-CM | POA: Diagnosis not present

## 2015-05-31 DIAGNOSIS — W891XXA Exposure to tanning bed, initial encounter: Secondary | ICD-10-CM

## 2015-05-31 DIAGNOSIS — L568 Other specified acute skin changes due to ultraviolet radiation: Secondary | ICD-10-CM

## 2015-05-31 DIAGNOSIS — L299 Pruritus, unspecified: Secondary | ICD-10-CM | POA: Diagnosis not present

## 2015-05-31 DIAGNOSIS — R234 Changes in skin texture: Secondary | ICD-10-CM | POA: Insufficient documentation

## 2015-05-31 DIAGNOSIS — R109 Unspecified abdominal pain: Secondary | ICD-10-CM | POA: Diagnosis present

## 2015-05-31 LAB — LIPASE, BLOOD: LIPASE: 29 U/L (ref 22–51)

## 2015-05-31 LAB — URINALYSIS, ROUTINE W REFLEX MICROSCOPIC
BILIRUBIN URINE: NEGATIVE
Glucose, UA: NEGATIVE mg/dL
HGB URINE DIPSTICK: NEGATIVE
KETONES UR: NEGATIVE mg/dL
Leukocytes, UA: NEGATIVE
Nitrite: NEGATIVE
PROTEIN: NEGATIVE mg/dL
Specific Gravity, Urine: 1.02 (ref 1.005–1.030)
UROBILINOGEN UA: 0.2 mg/dL (ref 0.0–1.0)
pH: 7 (ref 5.0–8.0)

## 2015-05-31 LAB — COMPREHENSIVE METABOLIC PANEL
ALBUMIN: 4.1 g/dL (ref 3.5–5.0)
ALT: 21 U/L (ref 17–63)
AST: 20 U/L (ref 15–41)
Alkaline Phosphatase: 58 U/L (ref 38–126)
Anion gap: 7 (ref 5–15)
BUN: 19 mg/dL (ref 6–20)
CO2: 28 mmol/L (ref 22–32)
Calcium: 9.1 mg/dL (ref 8.9–10.3)
Chloride: 108 mmol/L (ref 101–111)
Creatinine, Ser: 1.01 mg/dL (ref 0.61–1.24)
GFR calc Af Amer: >60 mL/min (ref >60)
GFR calc non Af Amer: >60 mL/min (ref >60)
Glucose, Bld: 101 mg/dL — ABNORMAL HIGH (ref 65–99)
POTASSIUM: 3.8 mmol/L (ref 3.5–5.1)
Sodium: 143 mmol/L (ref 135–145)
TOTAL PROTEIN: 7 g/dL (ref 6.5–8.1)
Total Bilirubin: 0.5 mg/dL (ref 0.3–1.2)

## 2015-05-31 LAB — CBC WITH DIFFERENTIAL/PLATELET
Basophils Absolute: 0 10*3/uL (ref 0.0–0.1)
Basophils Relative: 0 % (ref 0–1)
Eosinophils Absolute: 0.2 10*3/uL (ref 0.0–0.7)
Eosinophils Relative: 3 % (ref 0–5)
HEMATOCRIT: 42.1 % (ref 39.0–52.0)
Hemoglobin: 14.1 g/dL (ref 13.0–17.0)
LYMPHS PCT: 31 % (ref 12–46)
Lymphs Abs: 1.8 10*3/uL (ref 0.7–4.0)
MCH: 31 pg (ref 26.0–34.0)
MCHC: 33.5 g/dL (ref 30.0–36.0)
MCV: 92.5 fL (ref 78.0–100.0)
Monocytes Absolute: 0.4 10*3/uL (ref 0.1–1.0)
Monocytes Relative: 6 % (ref 3–12)
NEUTROS ABS: 3.6 10*3/uL (ref 1.7–7.7)
NEUTROS PCT: 60 % (ref 43–77)
PLATELETS: 192 10*3/uL (ref 150–400)
RBC: 4.55 MIL/uL (ref 4.22–5.81)
RDW: 12.2 % (ref 11.5–15.5)
WBC: 6 10*3/uL (ref 4.0–10.5)

## 2015-05-31 MED ORDER — ONDANSETRON HCL 4 MG/2ML IJ SOLN
4.0000 mg | Freq: Once | INTRAMUSCULAR | Status: AC
Start: 2015-05-31 — End: 2015-05-31
  Administered 2015-05-31: 4 mg via INTRAVENOUS
  Filled 2015-05-31: qty 2

## 2015-05-31 MED ORDER — KETOROLAC TROMETHAMINE 30 MG/ML IJ SOLN
30.0000 mg | Freq: Once | INTRAMUSCULAR | Status: AC
  Administered 2015-05-31: 30 mg via INTRAVENOUS
  Filled 2015-05-31: qty 1

## 2015-05-31 MED ORDER — SODIUM CHLORIDE 0.9 % IV BOLUS (SEPSIS)
1000.0000 mL | Freq: Once | INTRAVENOUS | Status: AC
  Administered 2015-05-31: 1000 mL via INTRAVENOUS

## 2015-05-31 NOTE — ED Notes (Signed)
Pt states that he has been having right sided abd pain for the past week or so.  States that it is worse at night and it eases off when he uses the bathroom.  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  Denies n/v/d.

## 2015-05-31 NOTE — Discharge Instructions (Signed)

## 2015-05-31 NOTE — ED Provider Notes (Signed)
CSN: [id number]     Arrival date & time 05/31/15  1129 History  This chart was scribed for Melene Plan, DO by Doreatha Martin, ED Scribe. This patient was seen in room APA14/APA14 and the patient's care was started at 12:13 PM.     Chief Complaint  Patient presents with  . Abdominal Pain   The history is provided by the patient. No language interpreter was used.    HPI Comments: Corey Ballard is a 34 y.o. male who presents to the Emergency Department complaining of moderate, intermittent, stabbing right-sided flank pain that onset 2 weeks ago. He reports associated itchiness, bowel movements immediately after eating. Pt states he was pushing a "dog sled" on the foot ball field when the pain began. He reports that the pain started in the right lower back (exacerbated by movement) and has since improved.He notes that he rested for a week after the initial pain. He attributes the itching and peeling skin to a sunburn 3 weeks ago.  Pt states pain is worsened by movement and relieved mildly by Ibuprofen. Pt states his bowel movements are normal. He denies antibiotic use, Hx of kidney stones. He also denies hematuria, dysuria, fevers, rashes, CP, SOB, testicular pain, penile pain.   History reviewed. No pertinent past medical history. History reviewed. No pertinent past surgical history. History reviewed. No pertinent family history. History  Substance Use Topics  . Smoking status: Never Smoker   . Smokeless tobacco: Not on file  . Alcohol Use: Not on file    Review of Systems  Constitutional: Negative for fever, chills, diaphoresis, appetite change and fatigue.  HENT: Negative for mouth sores, sore throat and trouble swallowing.   Eyes: Negative for visual disturbance.  Respiratory: Negative for cough, chest tightness, shortness of breath and wheezing.   Cardiovascular: Negative for chest pain.  Gastrointestinal: Positive for abdominal pain. Negative for nausea, vomiting, diarrhea and abdominal  distention.  Endocrine: Negative for polydipsia, polyphagia and polyuria.  Genitourinary: Negative for dysuria, frequency, hematuria, penile pain and testicular pain.  Musculoskeletal: Positive for back pain and arthralgias. Negative for gait problem.  Skin: Positive for color change. Negative for pallor and rash.       Itching and peeling   Neurological: Negative for dizziness, syncope, light-headedness and headaches.  Hematological: Does not bruise/bleed easily.  Psychiatric/Behavioral: Negative for behavioral problems and confusion.   Allergies  Erythromycin  Home Medications   Prior to Admission medications   Medication Sig Start Date End Date Taking? Authorizing Provider  HYDROcodone-acetaminophen (NORCO/VICODIN) 5-325 MG per tablet Take 1 tablet by mouth every 6 (six) hours as needed for moderate pain.   Yes Historical Provider, MD   BP 115/65 mmHg  Pulse 68  Temp(Src) 98.2 F (36.8 C) (Oral)  Resp 18  Ht  (1.803 m)  Wt 230 lb (104.327 kg)  BMI 32.09 kg/m2  SpO2 100% Physical Exam  Constitutional: He is oriented to person, place, and time. He appears well-developed and well-nourished. No distress.  HENT:  Head: Normocephalic.  Eyes: Conjunctivae are normal. Pupils are equal, round, and reactive to light. No scleral icterus.  Neck: Normal range of motion. Neck supple. No thyromegaly present.  Cardiovascular: Normal rate and regular rhythm.  Exam reveals no gallop and no friction rub.   No murmur heard. Pulmonary/Chest: Effort normal and breath sounds normal. No respiratory distress. He has no wheezes. He has no rales.  Lungs CTA bilaterally   Abdominal: Soft. Bowel sounds are normal. He exhibits no  distension. There is no tenderness. There is no rebound and negative Murphy's sign.  Benign abdominal exam. No flank tenderness. No signs of rash.   Musculoskeletal: Normal range of motion.  Neurological: He is alert and oriented to person, place, and time.  Skin: Skin  is warm and dry. No rash noted. There is erythema.  There is erythema in a band around the abdomen. Appears to be sunburn.   Psychiatric: He has a normal mood and affect. His behavior is normal.  Nursing note and vitals reviewed.   ED Course  Procedures (including critical care time) DIAGNOSTIC STUDIES: Oxygen Saturation is 100% on RA, normal by my interpretation.    COORDINATION OF CARE: 12:20 PM Discussed treatment plan with pt at bedside and pt agreed to plan.   Labs Review Labs Reviewed  COMPREHENSIVE METABOLIC PANEL - Abnormal; Notable for the following:    Glucose, Bld 101 (*)    All other components within normal limits  URINALYSIS, ROUTINE W REFLEX MICROSCOPIC (NOT AT [facility])  CBC WITH DIFFERENTIAL/PLATELET  LIPASE, BLOOD    Imaging Review No results found.   EKG Interpretation None      MDM   Final diagnoses:  Periumbilical abdominal pain  Sunburn due to tanning bed   34 yo M with a chief complaint of right-sided abdominal pain. The started couple weeks ago after he pushed the dog sled. The pain has resolved a complaining of right sided abdominal pain just lateral to the umbilicus. This pain is colicky improved with bowel movement having multiple bowel movements a day. Denies blood or dark stools. Patient otherwise well-appearing nontoxic. Benign abdominal exam. No pain in the right upper quadrant no pain at McBurney's point. Stable vital signs afebrile. Patient also complaining of itching over an area that appears to be sunburn. We'll check basic labs treat pain with Toradol and reassess.  Pain improved with Toradol. UA negative. Metabolic panel without abnormality. Patient feeling better will discharge home. Follow-up. I personally performed the services described in this documentation, which was scribed in my presence. The recorded information has been reviewed and is accurate.  5:25 PM:  I have discussed the diagnosis/risks/treatment options with the patient and  believe the pt to be eligible for discharge home to follow-up with PCP. We also discussed returning to the ED immediately if new or worsening sx occur. We discussed the sx which are most concerning (e.g., sudden worsening pain, fever, vomiting) that necessitate immediate return. Medications administered to the patient during their visit and any new prescriptions provided to the patient are listed below.  Medications given during this visit Medications  sodium chloride 0.9 % bolus 1,000 mL (0 mLs Intravenous Stopped 05/31/15 1405)  ketorolac (TORADOL) 30 MG/ML injection 30 mg (30 mg Intravenous Given 05/31/15 1246)  ondansetron (ZOFRAN) injection 4 mg (4 mg Intravenous Given 05/31/15 1246)    Discharge Medication List as of 05/31/2015  1:48 PM       The patient appears reasonably screen and/or stabilized for discharge and I doubt any other medical condition or other [facility] requiring further screening, evaluation, or treatment in the ED at this time prior to discharge.    Melene Plan, DO 05/31/15 1726

## 2015-06-03 ENCOUNTER — Encounter

## 2015-06-03 NOTE — Progress Notes (Signed)
Tried to call patient, but VM is not set up.

## 2015-08-25 ENCOUNTER — Encounter (HOSPITAL_COMMUNITY): Payer: Self-pay | Admitting: Emergency Medicine

## 2015-08-25 ENCOUNTER — Emergency Department (HOSPITAL_COMMUNITY)
Admission: EM | Admit: 2015-08-25 | Discharge: 2015-08-25 | Disposition: A | Discharge status: Home / Self Care | Payer: Worker's Compensation | Attending: Emergency Medicine | Admitting: Emergency Medicine

## 2015-08-25 ENCOUNTER — Emergency Department (HOSPITAL_COMMUNITY): Payer: Worker's Compensation

## 2015-08-25 DIAGNOSIS — Y9389 Activity, other specified: Secondary | ICD-10-CM | POA: Insufficient documentation

## 2015-08-25 DIAGNOSIS — X58XXXA Exposure to other specified factors, initial encounter: Secondary | ICD-10-CM | POA: Diagnosis not present

## 2015-08-25 DIAGNOSIS — S199XXA Unspecified injury of neck, initial encounter: Secondary | ICD-10-CM | POA: Diagnosis not present

## 2015-08-25 DIAGNOSIS — Z79899 Other long term (current) drug therapy: Secondary | ICD-10-CM | POA: Insufficient documentation

## 2015-08-25 DIAGNOSIS — Y990 Civilian activity done for income or pay: Secondary | ICD-10-CM | POA: Insufficient documentation

## 2015-08-25 DIAGNOSIS — M546 Pain in thoracic spine: Secondary | ICD-10-CM

## 2015-08-25 DIAGNOSIS — Y9289 Other specified places as the place of occurrence of the external cause: Secondary | ICD-10-CM | POA: Insufficient documentation

## 2015-08-25 DIAGNOSIS — S79922A Unspecified injury of left thigh, initial encounter: Secondary | ICD-10-CM | POA: Diagnosis not present

## 2015-08-25 DIAGNOSIS — S46811A Strain of other muscles, fascia and tendons at shoulder and upper arm level, right arm, initial encounter: Secondary | ICD-10-CM | POA: Diagnosis not present

## 2015-08-25 DIAGNOSIS — S3992XA Unspecified injury of lower back, initial encounter: Secondary | ICD-10-CM | POA: Insufficient documentation

## 2015-08-25 DIAGNOSIS — S79912A Unspecified injury of left hip, initial encounter: Secondary | ICD-10-CM | POA: Diagnosis not present

## 2015-08-25 DIAGNOSIS — S29002A Unspecified injury of muscle and tendon of back wall of thorax, initial encounter: Secondary | ICD-10-CM | POA: Insufficient documentation

## 2015-08-25 DIAGNOSIS — S4991XA Unspecified injury of right shoulder and upper arm, initial encounter: Secondary | ICD-10-CM | POA: Diagnosis present

## 2015-08-25 MED ORDER — HYDROCODONE-ACETAMINOPHEN 5-325 MG PO TABS
1.0000 | ORAL_TABLET | Freq: Once | ORAL | Status: AC
  Administered 2015-08-25: 1 via ORAL
  Filled 2015-08-25: qty 1

## 2015-08-25 MED ORDER — PREDNISONE 50 MG PO TABS
60.0000 mg | ORAL_TABLET | Freq: Once | ORAL | Status: AC
  Administered 2015-08-25: 60 mg via ORAL
  Filled 2015-08-25 (×2): qty 1

## 2015-08-25 MED ORDER — PREDNISONE 10 MG PO TABS: ORAL_TABLET | ORAL | Status: DC

## 2015-08-25 MED ORDER — HYDROCODONE-ACETAMINOPHEN 5-325 MG PO TABS: 1.0000 | ORAL_TABLET | ORAL | Status: DC | PRN

## 2015-08-25 MED ORDER — CYCLOBENZAPRINE HCL 10 MG PO TABS
10.0000 mg | ORAL_TABLET | Freq: Once | ORAL | Status: AC
  Administered 2015-08-25: 10 mg via ORAL
  Filled 2015-08-25: qty 1

## 2015-08-25 MED ORDER — CYCLOBENZAPRINE HCL 5 MG PO TABS: 5.0000 mg | ORAL_TABLET | Freq: Three times a day (TID) | ORAL | Status: DC | PRN

## 2015-08-25 NOTE — Discharge Instructions (Signed)
Muscle Strain A muscle strain is an injury that occurs when a muscle is stretched beyond its normal length. Usually a small number of muscle fibers are torn when this happens. Muscle strain is rated in degrees. First-degree strains have the least amount of muscle fiber tearing and pain. Second-degree and third-degree strains have increasingly more tearing and pain.  Usually, recovery from muscle strain takes 1-2 weeks. Complete healing takes 5-6 weeks.  CAUSES  Muscle strain happens when a sudden, violent force placed on a muscle stretches it too far. This may occur with lifting, sports, or a fall.  RISK FACTORS Muscle strain is especially common in athletes.  SIGNS AND SYMPTOMS At the site of the muscle strain, there may be:  Pain.  Bruising.  Swelling.  Difficulty using the muscle due to pain or lack of normal function. DIAGNOSIS  Your health care provider will perform a physical exam and ask about your medical history. TREATMENT  Often, the best treatment for a muscle strain is resting, icing, and applying cold compresses to the injured area.  HOME CARE INSTRUCTIONS   Use the PRICE method of treatment to promote muscle healing during the first 2-3 days after your injury. The PRICE method involves:  Protecting the muscle from being injured again.  Restricting your activity and resting the injured body part.  Icing your injury. To do this, put ice in a plastic bag. Place a towel between your skin and the bag. Then, apply the ice and leave it on from 15-20 minutes each hour. After the third day, switch to moist heat packs.  Apply compression to the injured area with a splint or elastic bandage. Be careful not to wrap it too tightly. This may interfere with blood circulation or increase swelling.  Elevate the injured body part above the level of your heart as often as you can.  Only take over-the-counter or prescription medicines for pain, discomfort, or fever as directed by your  health care provider.  Warming up prior to exercise helps to prevent future muscle strains. SEEK MEDICAL CARE IF:   You have increasing pain or swelling in the injured area.  You have numbness, tingling, or a significant loss of strength in the injured area. MAKE SURE YOU:   Understand these instructions.  Will watch your condition.  Will get help right away if you are not doing well or get worse.   This information is not intended to replace advice given to you by your health care provider. Make sure you discuss any questions you have with your health care provider.   Document Released: 10/18/2005 Document Revised: 08/08/2013 Document Reviewed: 05/17/2013 Elsevier Interactive Patient Education [employer]2016 [employer] Inc.   I recommend applying ice to your back and shoulder as much as possible for the next 2 days.  Starting on Wednesday,  You should also start using a heating pad for 20 minutes to your right neck and shoulder area followed by Range of motion stretching exercises to get this muscle "worked out" and stretched as discussed.  Your xrays are negative tonight.  You may take the hydrocodone prescribed for pain relief.  This will make you drowsy - do not drive within 4 hours of taking this medication.

## 2015-08-25 NOTE — ED Notes (Signed)
On the job injury.  Lifted heavy steel overhead and felt pull to lower back.  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  Rates pain 6/10 right shoulder, rates back pain 8/10 and neck pain 9/10.

## 2015-08-25 NOTE — ED Provider Notes (Signed)
CSN: [id number]     Arrival date & time 08/25/15  1649 History  By signing my name below, I, Corey Ballard, attest that this documentation has been prepared under the direction and in the presence of Burgess AmorJulie Kewan Mcnease, PA-C.  Electronically Signed: Gwenyth Oberatherine Ballard, ED Scribe. 08/25/2015. 7:32 PM.    Chief Complaint  Patient presents with  . Back Pain  . Neck Injury  . Shoulder Injury   The history is provided by the patient. No language interpreter was used.    HPI Comments: Corey Ballard is a 34 y.o. 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 Pt states aching, right shoulder pain as an associated symptom. He also notes left hip pain and left thigh pain that occurs with walking and just started a few minutes ago. His neck and shoulder pain become worse with turning his head to the right. Pt has tried stretching and taking Advil-200 mg with no relief. Pt reports that onset of pain started while he was lifting 400 lbs of steel from the ground position with another person. He is right-handed. Pt denies a history of back or neck injuries. He also denies numbness as an associated symptom.   History reviewed. No pertinent past medical history. History reviewed. No pertinent past surgical history. History reviewed. No pertinent family history. Social History  Substance Use Topics  . Smoking status: Never Smoker   . Smokeless tobacco: None  . Alcohol Use: None    Review of Systems  Constitutional: Negative for fever.  Respiratory: Negative for shortness of breath.   Cardiovascular: Negative for chest pain and leg swelling.  Gastrointestinal: Negative for abdominal pain, constipation and abdominal distention.  Genitourinary: Negative for dysuria, urgency, frequency, flank pain and difficulty urinating.  Musculoskeletal: Positive for back pain and neck pain. Negative for  joint swelling and gait problem.  Skin: Negative for rash.  Neurological: Negative for weakness and numbness.  All other systems reviewed and are negative.  Allergies  Erythromycin  Home Medications   Prior to Admission medications   Medication Sig Start Date End Date Taking? Authorizing Provider  flintstones complete (FLINTSTONES) 60 MG chewable tablet Chew 1 tablet by mouth daily.   Yes Historical Provider, MD  ibuprofen (ADVIL,MOTRIN) 200 MG tablet Take 200 mg by mouth once as needed for headache, mild pain or moderate pain.   Yes Historical Provider, MD  cyclobenzaprine (FLEXERIL) 5 MG tablet Take 1 tablet (5 mg total) by mouth 3 (three) times daily as needed for muscle spasms. 08/25/15   Burgess AmorJulie Lorna Strother, PA-C  HYDROcodone-acetaminophen (NORCO/VICODIN) 5-325 MG tablet Take 1 tablet by mouth every 4 (four) hours as needed. 08/25/15   Burgess AmorJulie Arielis Leonhart, PA-C  predniSONE (DELTASONE) 10 MG tablet 6, 5, 4, 3, 2 then 1 tablet by mouth daily for 6 days total. 08/25/15   Burgess AmorJulie Keeana Pieratt, PA-C   BP 112/75 mmHg  Pulse 65  Temp(Src) 98.6 F (37 C) (Oral)  Resp 16  Ht 5\' 11"  (1.803 m)  Wt 237 lb (107.502 kg)  BMI 33.07 kg/m2  SpO2 100% Physical Exam  Constitutional: He appears well-developed and well-nourished.  HENT:  Head: Normocephalic.  Eyes: Conjunctivae are normal.  Neck: Normal range of motion. Neck supple.  Cardiovascular: Normal rate and intact distal pulses.   Pedal pulses normal.  Pulmonary/Chest: Effort normal.  Abdominal: Soft. Bowel sounds are normal. He exhibits no distension and no mass.  Musculoskeletal: Normal range of motion. He exhibits tenderness. He exhibits no edema.       Lumbar back: He exhibits tenderness. He exhibits no swelling, no edema and no spasm.  TTP across right upper trapezius No midline cervical tenderness, but does have midline thoracic point tenderness No lumbar tenderness  Neurological: He is alert. He has normal strength. He displays no atrophy and no tremor.  No sensory deficit. Gait normal.  Reflex Scores:      Patellar reflexes are 2+ on the right side and 2+ on the left side.      Achilles reflexes are 2+ on the right side and 2+ on the left side. No strength deficit noted in hip and knee flexor and extensor muscle groups.  Ankle flexion and extension intact. Equal grip strength Bicep DTR 2+ bilaterally  Skin: Skin is warm and dry.  Psychiatric: He has a normal mood and affect.  Nursing note and vitals reviewed.   ED Course  Procedures  DIAGNOSTIC STUDIES: Oxygen Saturation is 100% on RA, normal by my interpretation.    COORDINATION OF CARE: 5:25 PM Discussed suspicion for muscle strain. Discussed treatment plan with pt which includes x-rays of thoracic and cervical spine, a muscle relaxer and anti-inflammatories. Pt agreed to plan.  7:29 PM Discussed x-ray results with pt and his mother. Advised rest, ice and stretches for his neck. Will prescribe Flexeril and refer to orthopedist. Pt agreed to plan.  Labs Review Labs Reviewed - No data to display  Imaging Review Dg Cervical Spine Complete  08/25/2015  CLINICAL DATA:  34 year old male with recent lifting injury. Neck pain. EXAM: CERVICAL SPINE - COMPLETE 4+ VIEW COMPARISON:  No priors. FINDINGS: There is no evidence of cervical spine fracture or prevertebral soft tissue swelling. Alignment is normal. No other significant bone abnormalities are identified. IMPRESSION: Negative cervical spine radiographs. Electronically Signed   By: Trudie Reed M.D.   On: 08/25/2015 18:48   Dg Thoracic Spine 2 View  08/25/2015  CLINICAL DATA:  Lifting injury EXAM: THORACIC SPINE 2 VIEWS COMPARISON:  None. FINDINGS: There is no evidence of thoracic spine fracture. Alignment is normal. No other significant bone abnormalities are identified. IMPRESSION: Negative. Electronically Signed   By: Natasha Mead M.D.   On: 08/25/2015 18:56   Dg Lumbar Spine Complete  08/25/2015  CLINICAL DATA:  34 year old  male with history of lifting injury. Low back pain. EXAM: LUMBAR SPINE - COMPLETE 4+ VIEW COMPARISON:  [facility]. FINDINGS: There is no evidence of lumbar spine fracture. Alignment is normal. Intervertebral disc spaces are maintained. IMPRESSION: Negative. Electronically Signed   By: Trudie Reed M.D.   On: 08/25/2015 18:48   I have personally reviewed and evaluated these images as part of my medical decision-making.   EKG Interpretation None      MDM   Final diagnoses:  Trapezius muscle strain, right, initial encounter  Bilateral thoracic back pain    Pt advised ice tx x 2 days, heat tx followed by ROM of c spine.  Prescribed prednisone, flexeril, hydrocodone. Advised f/u with ortho prn, referrals given.  Exam c/w trapezius muscle strain/spasm. Imaging reassuring today.  I personally performed the services described in this documentation, which was scribed in my presence. The recorded information has been reviewed and is accurate.   Burgess Amor, PA-C 08/27/15 1413  Zadie Rhine, MD 08/28/15 1351

## 2015-08-29 ENCOUNTER — Ambulatory Visit
Admit: 2015-08-29 | Discharge: 2015-08-29 | Payer: BLUE CROSS/BLUE SHIELD | Attending: Internal Medicine | Primary: Internal Medicine

## 2015-08-29 DIAGNOSIS — G5603 Carpal tunnel syndrome, bilateral upper limbs: Secondary | ICD-10-CM

## 2015-08-29 MED ORDER — ALPRAZOLAM 2 MG TAB
2 mg | ORAL_TABLET | ORAL | 3 refills | Status: DC
Start: 2015-08-29 — End: 2015-11-28

## 2015-08-29 MED ORDER — HYDROCODONE-ACETAMINOPHEN 10 MG-325 MG TAB
10-325 mg | ORAL_TABLET | Freq: Four times a day (QID) | ORAL | 0 refills | Status: DC | PRN
Start: 2015-08-29 — End: 2015-11-28

## 2015-08-29 MED ORDER — CYANOCOBALAMIN 1,000 MCG/ML IJ SOLN
1000 mcg/mL | Freq: Once | INTRAMUSCULAR | Status: AC
Start: 2015-08-29 — End: 2015-08-29
  Administered 2015-08-29: 21:00:00 via INTRAMUSCULAR

## 2015-08-29 MED ORDER — HYDROCODONE-ACETAMINOPHEN 10 MG-325 MG TAB
10-325 mg | ORAL_TABLET | Freq: Four times a day (QID) | ORAL | 0 refills | Status: DC | PRN
Start: 2015-08-29 — End: 2015-08-29

## 2015-08-29 NOTE — Addendum Note (Signed)
Addended by: Cindy HazyALLAS, Daylin Eads M on: 08/29/2015 05:05 PM      Modules accepted: Orders
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
## 2015-09-05 NOTE — Telephone Encounter (Signed)
I received notification from Dr. Beather ArbourMalvern that this patient (who I saw during an office visit with Dr. Beather ArbourMalvern some months ago) wanted to speak with me.  I have called his listed phone, but the prompt at answer indicated that his phone was not taking calls at the present time.  I will re-attempt contact on Monday.  Shelda PalGeorge Latika Kronick, LISW-CP

## 2015-09-12 NOTE — Telephone Encounter (Signed)
I have continued to try to reach this patient via phone as listed in the EMR; the recording indicates that the phone is set to not accept calls at this time.  Shelda PalGeorge Jaycion Treml, LISW-CP

## 2015-11-28 ENCOUNTER — Ambulatory Visit
Admit: 2015-11-28 | Discharge: 2015-11-28 | Payer: BLUE CROSS/BLUE SHIELD | Attending: Internal Medicine | Primary: Internal Medicine

## 2015-11-28 DIAGNOSIS — G5603 Carpal tunnel syndrome, bilateral upper limbs: Secondary | ICD-10-CM

## 2015-11-28 MED ORDER — HYDROCODONE-ACETAMINOPHEN 10 MG-325 MG TAB
10-325 mg | ORAL_TABLET | Freq: Four times a day (QID) | ORAL | 0 refills | Status: DC | PRN
Start: 2015-11-28 — End: 2015-11-28

## 2015-11-28 MED ORDER — ALPRAZOLAM 2 MG TAB
2 mg | ORAL_TABLET | ORAL | 5 refills | Status: DC
Start: 2015-11-28 — End: 2016-04-12

## 2015-11-28 MED ORDER — HYDROCODONE-ACETAMINOPHEN 10 MG-325 MG TAB
10-325 mg | ORAL_TABLET | Freq: Four times a day (QID) | ORAL | 0 refills | Status: DC | PRN
Start: 2015-11-28 — End: 2015-12-24

## 2015-11-28 NOTE — Progress Notes (Signed)
Shane Wagner is a 35 y.o. WHITE [place] male.    Scheduled for shoulder surgery--he has a slap lesion. Has been going to PT and pain is a little better.   Plans to have CTS surgery after he has healed from his shoulder surgery.  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  Needs refills on Norco and Xanax.  He is actually doing well.    Past Medical History   Diagnosis Date   ??? Arthralgia of shoulder 02/08/2014   ??? Carpal tunnel syndrome 02/08/2014   ??? Obesity 02/08/2014   ??? Other ill-defined conditions(799.89)      torn labrum   ??? Panic disorder 02/08/2014   ??? Psychiatric disorder      PTSD   ??? PTSD (post-traumatic stress disorder) 02/08/2014   ??? PUD (peptic ulcer disease)      History reviewed. No pertinent past surgical history.  Social History     Social History   ??? Marital status: MARRIED     Spouse name: N/A   ??? Number of children: N/A   ??? Years of education: N/A     Occupational History   ??? works in a Insurance claims handler      Social History Main Topics   ??? Smoking status: Never Smoker   ??? Smokeless tobacco: Current User      Comment: dip tobacco   ??? Alcohol use No      Comment: rare   ??? Drug use: No   ??? Sexual activity: Yes     Partners: Female     Birth control/ protection: Pill     Other Topics Concern   ??? Not on file     Social History Narrative     Family History   Problem Relation Age of Onset   ??? Other Mother      addicted to prescription pain med   ??? Other Father      cirrhosis     Current Outpatient Prescriptions   Medication Sig Dispense Refill   ??? ALPRAZolam (XANAX) 2 mg tablet TAKE 1 TABLET BY MOUTH FOUR TIMES DAILY. MAXIMUM DAILY AMOUNT: 8 MG( 4 TABLETS) 120 Tab 3   ??? HYDROcodone-acetaminophen (NORCO) 10-325 mg tablet Take 1 Tab by mouth every six (6) hours as needed. Max Daily Amount: 4 Tabs. 120 Tab 0     Allergies   Allergen Reactions   ??? Erythromycin Diarrhea and Nausea and Vomiting   ??? Penicillins Nausea and Vomiting     States not allergic to pcn   ??? Toradol [Ketorolac Tromethamine] Shortness of Breath        Review of Systems   Constitutional: Negative.    HENT: Negative.    Eyes: Negative for blurred vision, pain and redness.   Respiratory: Negative.    Cardiovascular: Negative.    Gastrointestinal: Negative.    Genitourinary: Negative.    Musculoskeletal: Positive for joint pain.   Skin: Negative for rash.   Neurological: Negative for dizziness, sensory change and focal weakness.        Bilat hand pain/CTS   Endo/Heme/Allergies: Negative.    Psychiatric/Behavioral: Negative for depression, memory loss and suicidal ideas. The patient is not nervous/anxious and does not have insomnia.         Ptsd       Visit Vitals   ??? BP 117/82 (BP 1 Location: Left arm, BP Patient Position: Sitting)   ??? Pulse 82   ??? Temp 98.1 ??F (36.7 ??C) (Oral)   ??? Resp  16   ??? Ht  (1.778 m)   ??? Wt 236 lb (107 kg)   ??? BMI 33.86 kg/m2       Physical Exam   Constitutional: He is oriented to person, place, and time and well-developed, well-nourished, and in no distress.   HENT:   Head: Normocephalic and atraumatic.   Right Ear: External ear normal.   Left Ear: External ear normal.   Mouth/Throat: Oropharynx is clear and moist.   Eyes: Conjunctivae and EOM are normal. Pupils are equal, round, and reactive to light.   Neck: Normal range of motion. Neck supple. No JVD present. No tracheal deviation present. No thyromegaly present.   Cardiovascular: Normal rate, regular rhythm and normal heart sounds.    Pulmonary/Chest: Effort normal.   Lymphadenopathy:     He has no cervical adenopathy.   Neurological: He is alert and oriented to person, place, and time. Gait normal.   Skin: Skin is warm and dry. No rash noted.   Psychiatric: Mood, memory, affect and judgment normal.   No other PE done today    Assessment and Plan    1. Bilateral carpal tunnel syndrome  WILL  HOLD OFF ON SURGERY UNTIL HE RECOVERS FROM HIS SHOULDER SURGERY    2. Obesity  HE IS WATCHING HIS DIET AND DOING CARDIO    3. Panic disorder  CONTINUE XANAX      4. PTSD (post-traumatic stress disorder)  HE REALLY NEEDS TO SEE A PSYCHIATRIST BUT THIS IS ON HOLD FOR NOW. HE REALLY LIKED [facility] BUT ONGOING COUNSELING NOT POSSIBLE AT THIS POINT    5. Arthralgia of shoulder, right  HE WILL HAVE SHOULDER SURGERY IN THE NEAR FUTURE. CONTINUE PT AND PAIN MEDICATION

## 2015-12-24 ENCOUNTER — Encounter

## 2015-12-24 MED ORDER — HYDROCODONE-ACETAMINOPHEN 10 MG-325 MG TAB
10-325 mg | ORAL_TABLET | Freq: Four times a day (QID) | ORAL | 0 refills | Status: DC | PRN
Start: 2015-12-24 — End: 2015-12-29

## 2015-12-24 NOTE — Telephone Encounter (Signed)
Patient states he recently had surgery; states it went better than he thought but still with staples in arm and bruising at arm.  Patient states he misplaced the written Rx he had for 01/26/16. He states no pain medication has been given from surgeon as he is under contract with Dr. Beather Arbour.  He would like to know if it can be mailed to him at [facility], [place] [address] as he can not drive to pick up Rx.   Please advise.

## 2015-12-25 NOTE — Telephone Encounter (Signed)
Will place medication in the mail to patient as requested. Malied today

## 2015-12-29 ENCOUNTER — Encounter

## 2015-12-29 MED ORDER — HYDROCODONE-ACETAMINOPHEN 10 MG-325 MG TAB
10-325 mg | ORAL_TABLET | Freq: Four times a day (QID) | ORAL | 0 refills | Status: DC | PRN
Start: 2015-12-29 — End: 2016-02-16

## 2015-12-29 NOTE — Telephone Encounter (Signed)
Rx will be mailed today to [address], [place] [address] as requested.

## 2015-12-29 NOTE — Telephone Encounter (Signed)
Patient states he miss spoke and needed Rx for 12/29/15 not March.  Patient did receive March Rx and will have pharmacy keep Rx on file until that date but needs February Rx mailed to him.

## 2016-02-06 ENCOUNTER — Encounter: Attending: Internal Medicine | Primary: Internal Medicine

## 2016-02-15 ENCOUNTER — Emergency Department (HOSPITAL_COMMUNITY): Payer: Self-pay

## 2016-02-15 ENCOUNTER — Emergency Department (HOSPITAL_COMMUNITY)
Admission: EM | Admit: 2016-02-15 | Discharge: 2016-02-15 | Disposition: A | Discharge status: Home / Self Care | Payer: Self-pay | Attending: Emergency Medicine | Admitting: Emergency Medicine

## 2016-02-15 ENCOUNTER — Encounter (HOSPITAL_COMMUNITY): Payer: Self-pay | Admitting: Emergency Medicine

## 2016-02-15 DIAGNOSIS — R1011 Right upper quadrant pain: Secondary | ICD-10-CM | POA: Insufficient documentation

## 2016-02-15 DIAGNOSIS — Z79899 Other long term (current) drug therapy: Secondary | ICD-10-CM | POA: Insufficient documentation

## 2016-02-15 DIAGNOSIS — R109 Unspecified abdominal pain: Secondary | ICD-10-CM

## 2016-02-15 DIAGNOSIS — G8929 Other chronic pain: Secondary | ICD-10-CM

## 2016-02-15 LAB — COMPREHENSIVE METABOLIC PANEL
ALBUMIN: 4.4 g/dL (ref 3.5–5.0)
ALK PHOS: 65 U/L (ref 38–126)
ALT: 40 U/L (ref 17–63)
AST: 28 U/L (ref 15–41)
Anion gap: 7 (ref 5–15)
BUN: 18 mg/dL (ref 6–20)
CALCIUM: 9.2 mg/dL (ref 8.9–10.3)
CO2: 27 mmol/L (ref 22–32)
CREATININE: 1.06 mg/dL (ref 0.61–1.24)
Chloride: 105 mmol/L (ref 101–111)
GFR calc non Af Amer: >60 mL/min (ref >60)
GLUCOSE: 108 mg/dL — AB (ref 65–99)
Potassium: 4 mmol/L (ref 3.5–5.1)
Sodium: 139 mmol/L (ref 135–145)
Total Bilirubin: 0.3 mg/dL (ref 0.3–1.2)
Total Protein: 7.4 g/dL (ref 6.5–8.1)

## 2016-02-15 LAB — CBC WITH DIFFERENTIAL/PLATELET
Basophils Absolute: 0 10*3/uL (ref 0.0–0.1)
Basophils Relative: 1 %
Eosinophils Absolute: 0.3 10*3/uL (ref 0.0–0.7)
Eosinophils Relative: 5 %
HEMATOCRIT: 43.4 % (ref 39.0–52.0)
Hemoglobin: 14.3 g/dL (ref 13.0–17.0)
Lymphocytes Relative: 30 %
Lymphs Abs: 1.8 10*3/uL (ref 0.7–4.0)
MCH: 30.3 pg (ref 26.0–34.0)
MCHC: 32.9 g/dL (ref 30.0–36.0)
MCV: 91.9 fL (ref 78.0–100.0)
MONO ABS: 0.4 10*3/uL (ref 0.1–1.0)
Monocytes Relative: 6 %
Neutro Abs: 3.6 10*3/uL (ref 1.7–7.7)
Neutrophils Relative %: 60 %
Platelets: 179 10*3/uL (ref 150–400)
RBC: 4.72 MIL/uL (ref 4.22–5.81)
RDW: 12 % (ref 11.5–15.5)
WBC: 6.1 10*3/uL (ref 4.0–10.5)

## 2016-02-15 LAB — URINALYSIS, ROUTINE W REFLEX MICROSCOPIC
BILIRUBIN URINE: NEGATIVE
GLUCOSE, UA: NEGATIVE mg/dL
HGB URINE DIPSTICK: NEGATIVE
Ketones, ur: NEGATIVE mg/dL
Leukocytes, UA: NEGATIVE
Nitrite: NEGATIVE
PH: 5.5 (ref 5.0–8.0)
Protein, ur: NEGATIVE mg/dL
Specific Gravity, Urine: 1.015 (ref 1.005–1.030)

## 2016-02-15 LAB — LIPASE, BLOOD: Lipase: 44 U/L (ref 11–51)

## 2016-02-15 MED ORDER — KETOROLAC TROMETHAMINE 30 MG/ML IJ SOLN
30.0000 mg | Freq: Once | INTRAMUSCULAR | Status: AC
  Administered 2016-02-15: 30 mg via INTRAVENOUS
  Filled 2016-02-15: qty 1

## 2016-02-15 MED ORDER — DIATRIZOATE MEGLUMINE & SODIUM 66-10 % PO SOLN
ORAL | Status: AC
  Administered 2016-02-15: 30 mL
  Filled 2016-02-15: qty 30

## 2016-02-15 MED ORDER — IOPAMIDOL (ISOVUE-300) INJECTION 61%
125.0000 mL | Freq: Once | INTRAVENOUS | Status: AC | PRN
  Administered 2016-02-15: 150 mL via INTRAVENOUS

## 2016-02-15 MED ORDER — SODIUM CHLORIDE 0.9 % IV SOLN
INTRAVENOUS | Status: DC
  Administered 2016-02-15: 08:00:00 via INTRAVENOUS

## 2016-02-15 MED ORDER — ONDANSETRON HCL 4 MG PO TABS: 4.0000 mg | ORAL_TABLET | Freq: Three times a day (TID) | ORAL | Status: DC | PRN

## 2016-02-15 NOTE — Discharge Instructions (Signed)
Eat a bland diet, avoiding greasy, fatty, fried foods, as well as spicy and acidic foods or beverages.  Avoid eating within the hour or 2 before going to bed or laying down.  Also avoid teas, colas, coffee, chocolate, pepermint and spearment.  Take over the counter pepcid, one tablet by mouth twice a day, for the next 2 to 3 weeks.  May also take over the counter maalox/mylanta, as directed on packaging, as needed for discomfort.  Take the prescription as directed.  Call your regular medical doctor and the GI doctor tomorrow to schedule a follow up appointment this week.  Return to the Emergency Department immediately if worsening.

## 2016-02-15 NOTE — ED Notes (Signed)
Nurse tech informed nurse that pt was attempting to take his IV out.  Kiarah, NT assisted pt to take out IV.  Pt states that hes leaving at this time because "that doctor hasn't helped me none, I just want a different doctor."  Pt told nurse that he was going to be discharged and that he did not want to wait for discharge papers.  Pt left without signing, ambulatory at discharge.  Dr. Clarene DukeMcManus notified.

## 2016-02-15 NOTE — ED Notes (Addendum)
Per pt, he started having right upper abdominal pain a month ago.  Pt states he has had an US before, but was told he needed to have a CT.  Pt states that the pain has increased x3 days.  He has been able to eat but in decreased portions.  Pt states he has right back pain as well and is relieved when he uses the restroom.

## 2016-02-15 NOTE — ED Provider Notes (Signed)
CSN: [id number]     Arrival date & time 02/15/16  4098 History   First MD Initiated Contact with Patient 02/15/16 [id number]     Chief Complaint  Patient presents with  . Abdominal Pain      HPI Pt was seen at 0800.  Per pt, c/o gradual onset and persistence of constant right sided abd "pain" for the past 6+ months, worse over the past month.  Describes the abd pain as "sharp." Pt was evaluated approximately 6 months ago at [facility] ED for this pain, with negative [place]. Pt states he was told at that time he "needed a CT scan." Pt did not f/u with GI MD or PMD as instructed "because I'm from [place] and just up here visiting."  Denies N/V, no diarrhea, no fevers, no back pain, no rash, no CP/SOB, no black or blood in stools, no dysuria/hematuria, no testicular pain/swelling.       History reviewed. No pertinent past medical history.   Past Surgical History  Procedure Laterality Date  . Bicept tenodesis      Social History  Substance Use Topics  . Smoking status: Never Smoker   . Smokeless tobacco: None  . Alcohol Use: No    Review of Systems ROS: Statement: All systems negative except as marked or noted in the HPI; Constitutional: Negative for fever and chills. ; ; Eyes: Negative for eye pain, redness and discharge. ; ; ENMT: Negative for ear pain, hoarseness, nasal congestion, sinus pressure and sore throat. ; ; Cardiovascular: Negative for chest pain, palpitations, diaphoresis, dyspnea and peripheral edema. ; ; Respiratory: Negative for cough, wheezing and stridor. ; ; Gastrointestinal: Negative for nausea, vomiting, diarrhea, blood in stool, hematemesis, jaundice and rectal bleeding. . ; ; Genitourinary: Negative for dysuria, flank pain and hematuria. ; ; Genital:  No penile drainage or rash, no testicular pain or swelling, no scrotal rash or swelling. ;; Musculoskeletal: Negative for back pain and neck pain. Negative for swelling and trauma.; ; Skin: Negative for pruritus, rash,  abrasions, blisters, bruising and skin lesion.; ; Neuro: Negative for headache, lightheadedness and neck stiffness. Negative for weakness, altered level of consciousness , altered mental status, extremity weakness, paresthesias, involuntary movement, seizure and syncope.      Allergies  Erythromycin  Home Medications   Prior to Admission medications   Medication Sig Start Date End Date Taking? Authorizing Provider  cyclobenzaprine (FLEXERIL) 5 MG tablet Take 1 tablet (5 mg total) by mouth 3 (three) times daily as needed for muscle spasms. 08/25/15   Burgess Amor, PA-C  flintstones complete (FLINTSTONES) 60 MG chewable tablet Chew 1 tablet by mouth daily.    Historical Provider, MD  HYDROcodone-acetaminophen (NORCO/VICODIN) 5-325 MG tablet Take 1 tablet by mouth every 4 (four) hours as needed. 08/25/15   Burgess Amor, PA-C  ibuprofen (ADVIL,MOTRIN) 200 MG tablet Take 200 mg by mouth once as needed for headache, mild pain or moderate pain.    Historical Provider, MD  predniSONE (DELTASONE) 10 MG tablet 6, 5, 4, 3, 2 then 1 tablet by mouth daily for 6 days total. 08/25/15   Burgess Amor, PA-C   BP 143/98 mmHg  Pulse 83  Temp(Src) 97.7 F (36.5 C)  Resp 16  Ht 6' (1.829 m)  Wt 245 lb (111.131 kg)  BMI 33.22 kg/m2  SpO2 99% Physical Exam  0805: Physical examination:  Nursing notes reviewed; Vital signs and O2 SAT reviewed;  Constitutional: Well developed, Well nourished, Well hydrated, In no acute distress; Head:  Normocephalic, atraumatic; Eyes: EOMI, PERRL, No scleral icterus; ENMT: Mouth and pharynx normal, Mucous membranes moist; Neck: Supple, Full range of motion, No lymphadenopathy; Cardiovascular: Regular rate and rhythm, No murmur, rub, or gallop; Respiratory: Breath sounds clear & equal bilaterally, No rales, rhonchi, wheezes.  Speaking full sentences with ease, Normal respiratory effort/excursion; Chest: Nontender, Movement normal; Abdomen: Soft, +right sided abd tender to palp. No rebound  or guarding. Nondistended, Normal bowel sounds; Genitourinary: No CVA tenderness; Extremities: Pulses normal, No tenderness, No edema, No calf edema or asymmetry.; Neuro: AA&Ox3, Major CN grossly intact.  Speech clear. No gross focal motor or sensory deficits in extremities.; Skin: Color normal, Warm, Dry.   ED Course  Procedures (including critical care time) Labs Review  Imaging Review  I have personally reviewed and evaluated these images and lab results as part of my medical decision-making.   EKG Interpretation None      MDM  MDM Reviewed: previous chart, nursing note and vitals Reviewed previous: labs and ultrasound Interpretation: labs and CT scan     Results for orders placed or performed during the hospital encounter of 02/15/16  Comprehensive metabolic panel  Result Value Ref Range   Sodium 139 135 - 145 mmol/L   Potassium 4.0 3.5 - 5.1 mmol/L   Chloride 105 101 - 111 mmol/L   CO2 27 22 - 32 mmol/L   Glucose, Bld 108 (H) 65 - 99 mg/dL   BUN 18 6 - 20 mg/dL   Creatinine, Ser 4.091.06 0.61 - 1.24 mg/dL   Calcium 9.2 8.9 - 81.110.3 mg/dL   Total Protein 7.4 6.5 - 8.1 g/dL   Albumin 4.4 3.5 - 5.0 g/dL   AST 28 15 - 41 U/L   ALT 40 17 - 63 U/L   Alkaline Phosphatase 65 38 - 126 U/L   Total Bilirubin 0.3 0.3 - 1.2 mg/dL   GFR calc non Af Amer >60 >60 mL/min   GFR calc Af Amer >60 >60 mL/min   Anion gap 7 5 - 15  Lipase, blood  Result Value Ref Range   Lipase 44 11 - 51 U/L  CBC with Differential  Result Value Ref Range   WBC 6.1 4.0 - 10.5 K/uL   RBC 4.72 4.22 - 5.81 MIL/uL   Hemoglobin 14.3 13.0 - 17.0 g/dL   HCT 91.443.4 78.239.0 - 95.652.0 %   MCV 91.9 78.0 - 100.0 fL   MCH 30.3 26.0 - 34.0 pg   MCHC 32.9 30.0 - 36.0 g/dL   RDW 21.312.0 08.611.5 - 57.815.5 %   Platelets 179 150 - 400 K/uL   Neutrophils Relative % 60 %   Neutro Abs 3.6 1.7 - 7.7 K/uL   Lymphocytes Relative 30 %   Lymphs Abs 1.8 0.7 - 4.0 K/uL   Monocytes Relative 6 %   Monocytes Absolute 0.4 0.1 - 1.0 K/uL    Eosinophils Relative 5 %   Eosinophils Absolute 0.3 0.0 - 0.7 K/uL   Basophils Relative 1 %   Basophils Absolute 0.0 0.0 - 0.1 K/uL  Urinalysis, Routine w reflex microscopic  Result Value Ref Range   Color, Urine YELLOW YELLOW   APPearance CLEAR CLEAR   Specific Gravity, Urine 1.015 1.005 - 1.030   pH 5.5 5.0 - 8.0   Glucose, UA NEGATIVE NEGATIVE mg/dL   Hgb urine dipstick NEGATIVE NEGATIVE   Bilirubin Urine NEGATIVE NEGATIVE   Ketones, ur NEGATIVE NEGATIVE mg/dL   Protein, ur NEGATIVE NEGATIVE mg/dL   Nitrite NEGATIVE NEGATIVE  Leukocytes, UA NEGATIVE NEGATIVE   Ct Abdomen Pelvis W Contrast 02/15/2016  CLINICAL DATA:  Right upper quadrant abdominal pain the past month, increased for the past 3 days. EXAM: CT ABDOMEN AND PELVIS WITH CONTRAST TECHNIQUE: Multidetector CT imaging of the abdomen and pelvis was performed using the standard protocol following bolus administration of intravenous contrast. CONTRAST:  ISOVUE-300 IOPAMIDOL (ISOVUE-300) INJECTION 61% COMPARISON:  Abdomen ultrasound dated 09/16/2015. FINDINGS: Lower chest: Minimal atelectasis or scarring in the right middle lobe and lingula. Hepatobiliary: Diffuse low density of the liver relative to the spleen. Poorly distended gallbladder with mild diffuse wall thickening and enhancement. No visible gallstones. No pericholecystic fluid. Pancreas: No mass, inflammatory changes, or other significant abnormality. Spleen: Within normal limits in size and appearance. Adrenals/Urinary Tract: Normal appearing adrenal glands, kidneys, ureters and urinary bladder. No urinary tract calculi or hydronephrosis. Stomach/Bowel: No gastrointestinal abnormalities. Normal appearing appendix. Vascular/Lymphatic: No pathologically enlarged lymph nodes. No evidence of abdominal aortic aneurysm. Reproductive: No mass or other significant abnormality. Other: None. Musculoskeletal: Minimal lumbar and lower thoracic spine degenerative changes. IMPRESSION: 1.  Poorly distended gallbladder with mild diffuse wall thickening and enhancement. The wall thickening and enhancement can be normal for an under distended gallbladder. If there is a clinical concern for the possibility of cholecystitis, a right upper quadrant abdominal ultrasound would be recommended. 2. Diffuse hepatic steatosis. Electronically Signed   By: Beckie Salts M.D.   On: 02/15/2016 09:15   US Abdomen Limited 02/15/2016  CLINICAL DATA:  Right upper quadrant abdominal pain for the past 6 months, increased over the past 3 days. Poorly distended gallbladder with mild diffuse wall thickening and enhancement on a CT earlier today. EXAM: US ABDOMEN LIMITED - RIGHT UPPER QUADRANT COMPARISON:  Abdomen CT earlier today. FINDINGS: Gallbladder: Poorly distended, better than on the CT earlier today. No gallstones, wall thickening or pericholecystic fluid. Common bile duct: Diameter: 4.7 mm Liver: Diffusely echogenic.  Corresponding low density on the previous CT. IMPRESSION: 1. No cholelithiasis or evidence of acute cholecystitis. 2. Diffuse hepatic steatosis. Electronically Signed   By: Beckie Salts M.D.   On: 02/15/2016 10:43    09/16/2015 [place] abd: negative for acute pathology.    0825:  Concerned regarding pt's varying HPI (initially told me the pain was "new" over the past few days, told ED RN pain was present for the past month, and the [place] abd viewed via EPIC has HPI as pain present "for weeks" when he was evaluated 09/16/2015.  [place] Controlled Substance Database accessed: in the past 3 months, pt has received 6 different narcotic prescriptions, written by 3 different medical providers in 2 different states ([place] and [place]), and filled at 5 different pharmacies; on 02/09/16 pt filled hydrocodone /APAP , #40 tabs, and on 01/29/2016 pt filled hydrocodone /APAP , #120 tabs; pt also has been filling monthly hydrocodone and xanax prescriptions written by a family practice office in [place]. [place]  [place] accessed: Pt last filled narcotic rx in [place] in 2016.  Pt will receive toradol for pain while workup progresses.   1045:  Workup is reassuring. Pt continues to c/o pain and that he "can't eat," but has tol PO well while in the ED today. Aware he will not be receiving narcotics.  Pt agitated and is pulling out his IV, stating he is "leaving now." Dx and testing d/w pt.  Questions answered.  Verb understanding, agreeable to d/c home with outpt f/u.  Pt strongly encouraged to f/u with his PMD  and and GI doctor for good continuity of care and control of his chronic pain.  Pt verb understanding.       Samuel Jester, DO 02/19/16 1901

## 2016-02-16 ENCOUNTER — Encounter

## 2016-02-16 NOTE — Telephone Encounter (Signed)
Patient requesting refill on Norco, states he will be due on 02/21/16. He would like to know if it can be mailed to him at [facility], [place] [address] as he can not drive to pick up Rx. Patient is scheduled for follow up on 03/19/16.

## 2016-02-17 MED ORDER — HYDROCODONE-ACETAMINOPHEN 10 MG-325 MG TAB
10-325 mg | ORAL_TABLET | Freq: Four times a day (QID) | ORAL | 0 refills | Status: DC | PRN
Start: 2016-02-17 — End: 2016-03-25

## 2016-02-17 NOTE — Telephone Encounter (Signed)
Patient notified that Rx will be mailed to address given. Patient verbalized understanding.

## 2016-03-19 ENCOUNTER — Encounter: Attending: Internal Medicine | Primary: Internal Medicine

## 2016-03-25 ENCOUNTER — Encounter

## 2016-03-25 NOTE — Telephone Encounter (Signed)
Patient states he missed appt with Dr. Beather ArbourMalvern because surgeons had to mend ligament in his arm. He was released from surgeon's care yesterday. States he will need a refill on Norco and needs it mailed to him. Please advise.

## 2016-03-26 MED ORDER — HYDROCODONE-ACETAMINOPHEN 10 MG-325 MG TAB
10-325 mg | ORAL_TABLET | Freq: Four times a day (QID) | ORAL | 0 refills | Status: DC | PRN
Start: 2016-03-26 — End: 2016-04-27

## 2016-03-26 NOTE — Telephone Encounter (Signed)
Patient notified that Rx will be put in mail. Patient verbalized understanding. Patient states surgeon has informed him that it is possible he is not healing properly from surgery and they may have to do an additional surgery in August. Patient also scheduled follow up appt on 05/24/16 @ 4:20pm.

## 2016-04-01 NOTE — Telephone Encounter (Signed)
Shane Wagner with [facility] states patient received Xanax 2mg  #120 from their pharmacy on 02/13/16 with original Rx written date of 11/28/15 and then also [facility]  [facility] 02/[facility]/16 with original written date of 02/21/16. Pharmacists would like to know if Dr. Beather ArbourMalvern wants [facility] at [facility] or both. States patient paid one Rx through a workers comp case and the other paid cash. Please advise.

## 2016-04-02 NOTE — Telephone Encounter (Signed)
Yes, [facility]. Were both written by me??? Thanks!

## 2016-04-02 NOTE — Telephone Encounter (Signed)
[facility]. She verbalized understanding.

## 2016-04-12 ENCOUNTER — Encounter

## 2016-04-12 MED ORDER — ALPRAZOLAM 2 MG TAB
2 mg | ORAL_TABLET | ORAL | 5 refills | Status: DC
Start: 2016-04-12 — End: 2016-07-20

## 2016-04-12 NOTE — Telephone Encounter (Signed)
Patient is requesting refill on Xanax.

## 2016-04-13 NOTE — Telephone Encounter (Signed)
Patient notified that Rx was ready for pick up but patient is unable to drive at this time. Rx mailed to [address], [place] [id number] as requested. Patient verbalized understanding.

## 2016-04-27 ENCOUNTER — Encounter

## 2016-04-27 MED ORDER — HYDROCODONE-ACETAMINOPHEN 10 MG-325 MG TAB
10-325 mg | ORAL_TABLET | Freq: Four times a day (QID) | ORAL | 0 refills | Status: DC | PRN
Start: 2016-04-27 — End: 2016-05-24

## 2016-04-27 NOTE — Telephone Encounter (Signed)
Patient is requesting refill on Norco; appt scheduled for 05/24/16. Patient will need Rx mailed to him. [facility] report printed for Dr. Beather ArbourMalvern to view.

## 2016-04-28 NOTE — Telephone Encounter (Signed)
Patient notified that Rx will be placed in the mail today. Patient verbalized understanding.

## 2016-05-24 ENCOUNTER — Encounter

## 2016-05-24 ENCOUNTER — Encounter: Attending: Internal Medicine | Primary: Internal Medicine

## 2016-05-24 MED ORDER — HYDROCODONE-ACETAMINOPHEN 10 MG-325 MG TAB
10-325 mg | ORAL_TABLET | Freq: Four times a day (QID) | ORAL | 0 refills | Status: DC | PRN
Start: 2016-05-24 — End: 2016-06-14

## 2016-05-24 NOTE — Telephone Encounter (Signed)
Patient requesting refill on Norco. PMP printed for Dr. Beather Arbour to review. Follow up scheduled for 06/14/16.

## 2016-05-24 NOTE — Telephone Encounter (Signed)
Patient notified that Rx will be placed in mail to [address] Shane Wagner [place] [address]. Patient is aware to keep 06/14/16 appt or else no other Rx will be given. Patient verbalized understanding.

## 2016-06-14 ENCOUNTER — Encounter: Attending: Internal Medicine | Primary: Internal Medicine

## 2016-06-14 ENCOUNTER — Encounter

## 2016-06-14 MED ORDER — HYDROCODONE-ACETAMINOPHEN 10 MG-325 MG TAB
10-325 mg | ORAL_TABLET | Freq: Four times a day (QID) | ORAL | 0 refills | Status: DC | PRN
Start: 2016-06-14 — End: 2016-07-20

## 2016-06-14 NOTE — Telephone Encounter (Signed)
Patient waited for Rx while in office.

## 2016-06-14 NOTE — Telephone Encounter (Signed)
Patient showed up an hour late to appt today; thought his appt was 10:00am not 9:00am. Patient rescheduled appt for 07/20/16 but will need Norco refill before then.

## 2016-06-22 IMAGING — DX DG THORACIC SPINE 2V
2 series · 2 of 2 positions shown · non-contrast
Comparison: None.

CLINICAL DATA: Lifting injury

EXAM:
THORACIC SPINE 2 VIEWS

[t-spine ap]
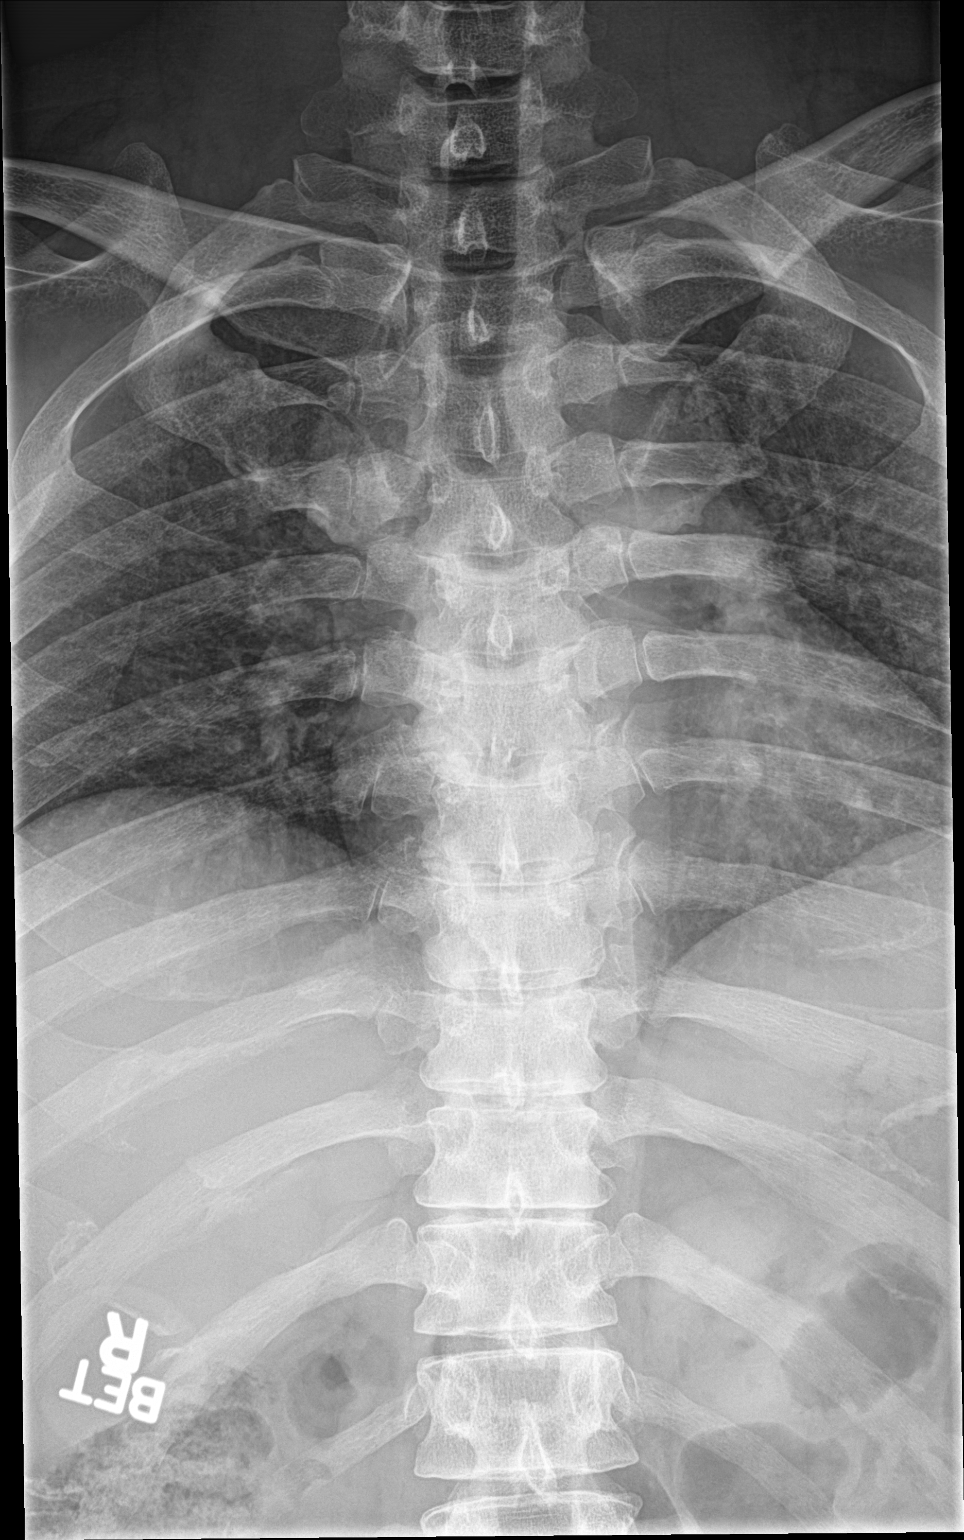

[t-spine lat]
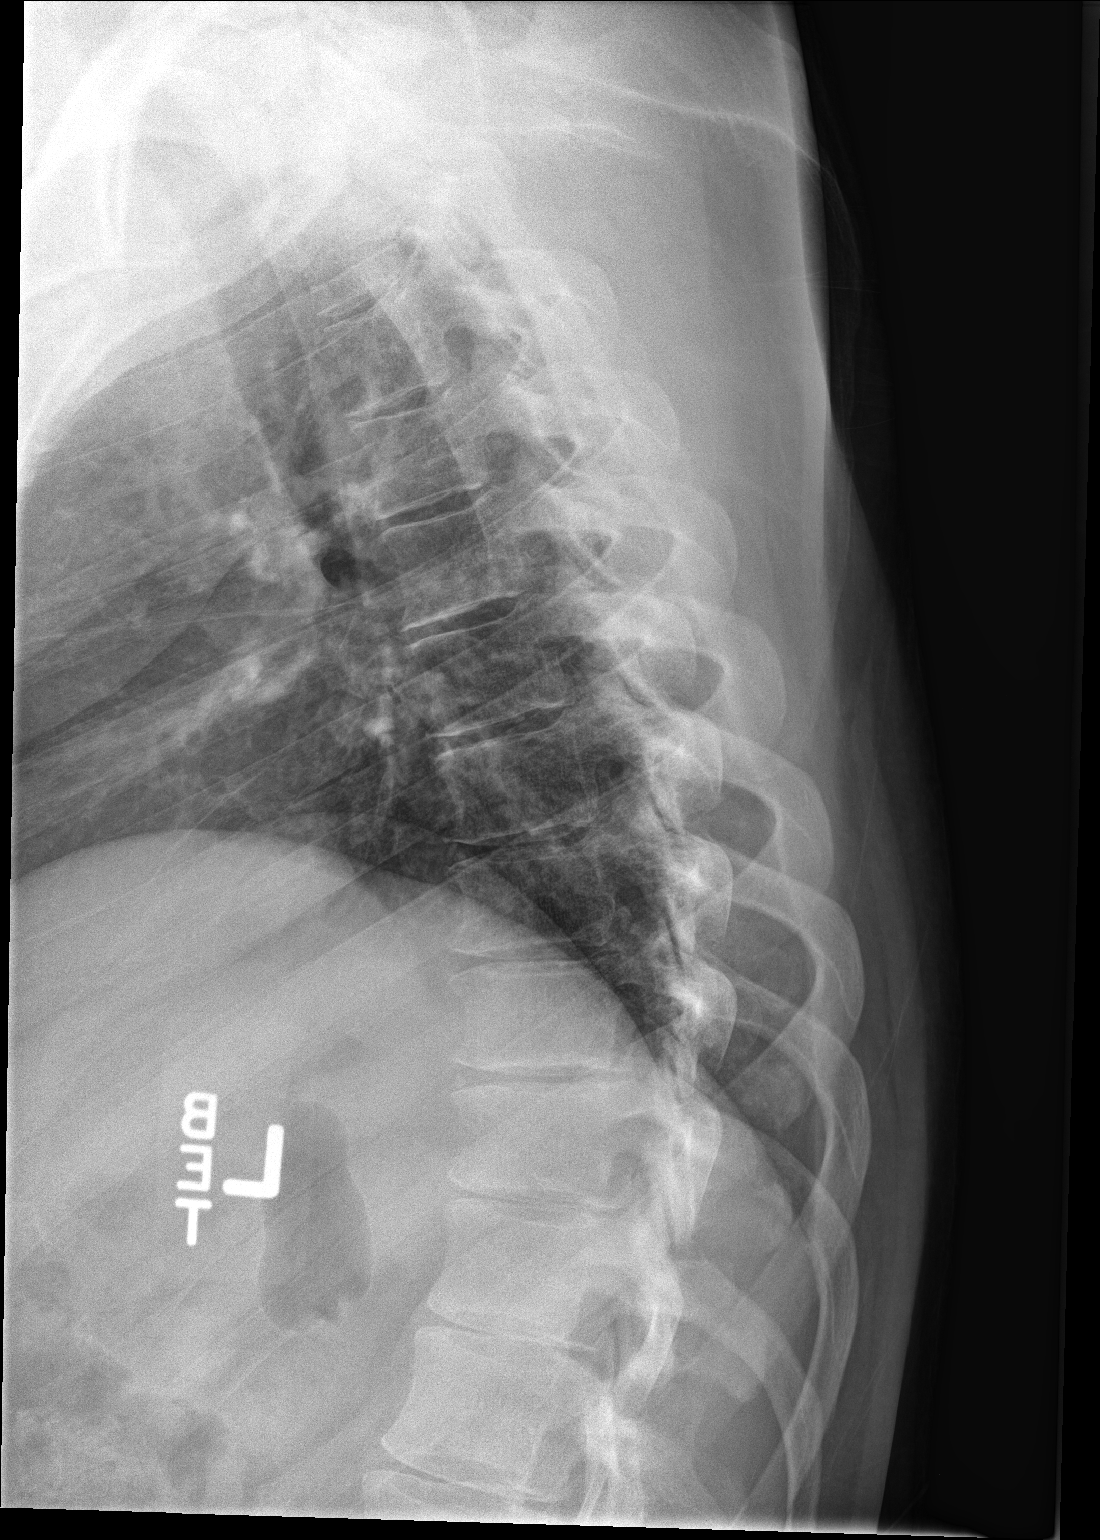

[2 of 2 positions shown; findings below may reference images not displayed]

FINDINGS: There is no evidence of thoracic spine fracture. Alignment is
normal. No other significant bone abnormalities are identified.
IMPRESSION: Negative.

## 2016-07-20 ENCOUNTER — Ambulatory Visit: Admit: 2016-07-20 | Discharge: 2016-07-20 | Attending: Internal Medicine | Primary: Internal Medicine

## 2016-07-20 DIAGNOSIS — F4310 Post-traumatic stress disorder, unspecified: Secondary | ICD-10-CM

## 2016-07-20 MED ORDER — HYDROCODONE-ACETAMINOPHEN 10 MG-325 MG TAB
10-325 mg | ORAL_TABLET | Freq: Four times a day (QID) | ORAL | 0 refills | Status: DC | PRN
Start: 2016-07-20 — End: 2016-07-20

## 2016-07-20 MED ORDER — ALPRAZOLAM 2 MG TAB
2 mg | ORAL_TABLET | ORAL | 5 refills | Status: DC
Start: 2016-07-20 — End: 2016-10-14

## 2016-07-20 MED ORDER — HYDROCODONE-ACETAMINOPHEN 10 MG-325 MG TAB
10-325 mg | ORAL_TABLET | Freq: Four times a day (QID) | ORAL | 0 refills | Status: DC | PRN
Start: 2016-07-20 — End: 2016-10-19

## 2016-07-20 NOTE — Progress Notes (Signed)
Shane Wagner is a 35 y.o. WHITE [place] male.    He is now living in [facility].  Has been seeing a therapist which has been very helpful. Therapist has recommended treatment for ADD but he will need to see a psychiatrist for that.   Had right shoulder surgery but he needs to have another surgery. He still needs carpal tunnel repair.   Needs refills on Norco. [facility] data base queried by my delegate but pt getting script filled in [place] so no info in data base. New controlled substances contract signed today.   He is actually doing well.    Past Medical History:   Diagnosis Date   ??? Arthralgia of shoulder 02/08/2014   ??? Carpal tunnel syndrome 02/08/2014   ??? Obesity 02/08/2014   ??? Other ill-defined conditions     torn labrum   ??? Panic disorder 02/08/2014   ??? Psychiatric disorder     PTSD   ??? PTSD (post-traumatic stress disorder) 02/08/2014   ??? PUD (peptic ulcer disease)      History reviewed. No pertinent surgical history.  Social History     Social History   ??? Marital status: MARRIED     Spouse name: N/A   ??? Number of children: N/A   ??? Years of education: N/A     Occupational History   ??? works in a Insurance claims handler      Social History Main Topics   ??? Smoking status: Never Smoker   ??? Smokeless tobacco: Current User      Comment: dip tobacco   ??? Alcohol use No      Comment: rare   ??? Drug use: No   ??? Sexual activity: Yes     Partners: Female     Birth control/ protection: Pill     Other Topics Concern   ??? Not on file     Social History Narrative     Family History   Problem Relation Age of Onset   ??? Other Mother      addicted to prescription pain med   ??? Other Father      cirrhosis     Current Outpatient Prescriptions   Medication Sig Dispense Refill   ??? ALPRAZolam (XANAX) 2 mg tablet TAKE 1 TABLET BY MOUTH FOUR TIMES DAILY. MAXIMUM DAILY AMOUNT: 8 MG( 4 TABLETS) 120 Tab 5   ??? [START ON 09/18/2016] HYDROcodone-acetaminophen (NORCO) 10-325 mg tablet Take 1 Tab by mouth every six (6) hours as needed. Max Daily Amount: 4  Tabs. 120 Tab 0     Allergies   Allergen Reactions   ??? Erythromycin Diarrhea and Nausea and Vomiting   ??? Penicillins Nausea and Vomiting     States not allergic to pcn   ??? Toradol [Ketorolac Tromethamine] Shortness of Breath       Review of Systems   Constitutional: Negative.    HENT: Negative.    Eyes: Negative for blurred vision, pain and redness.   Respiratory: Negative.    Cardiovascular: Negative.    Gastrointestinal: Negative.    Genitourinary: Negative.    Musculoskeletal: Positive for joint pain.   Skin: Negative for rash.   Neurological: Negative for dizziness, sensory change and focal weakness.        Bilat hand pain/CTS   Endo/Heme/Allergies: Negative.    Psychiatric/Behavioral: Negative for depression, memory loss and suicidal ideas. The patient is not nervous/anxious and does not have insomnia.         Ptsd  Visit Vitals   ??? BP 114/79 (BP 1 Location: Left arm, BP Patient Position: Sitting)   ??? Pulse 63   ??? Temp 98.3 ??F (36.8 ??C) (Oral)   ??? Resp 16   ??? Ht 5\' 10"  (1.778 m)   ??? Wt 227 lb 6.4 oz (103.1 kg)   ??? SpO2 99%   ??? BMI 32.63 kg/m2       Physical Exam   Constitutional: He is oriented to person, place, and time and well-developed, well-nourished, and in no distress.   HENT:   Head: Normocephalic and atraumatic.   Right Ear: External ear normal.   Left Ear: External ear normal.   Mouth/Throat: Oropharynx is clear and moist.   Eyes: Conjunctivae and EOM are normal. Pupils are equal, round, and reactive to light.   Neck: Normal range of motion. Neck supple. No JVD present. No tracheal deviation present. No thyromegaly present.   Cardiovascular: Normal rate, regular rhythm and normal heart sounds.    Pulmonary/Chest: Effort normal.   Lymphadenopathy:     He has no cervical adenopathy.   Neurological: He is alert and oriented to person, place, and time. Gait normal.   Skin: Skin is warm and dry. No rash noted.   Psychiatric: Mood, memory, affect and judgment normal.     Assessment and Plan     1. Bilateral carpal tunnel syndrome  WILL  HOLD OFF ON SURGERY UNTIL HE RECOVERS FROM HIS SHOULDER SURGERY    2. Obesity  HE IS WATCHING HIS DIET AND DOING CARDIO    3. Panic disorder  CONTINUE XANAX     4. PTSD (post-traumatic stress disorder)  NOW FOLLOWING WITH A THERAPIST WHICH HAS REALLY HELPED HIM. HE ALSO HAS A THERAPY DOG. STATES HIS THERAPIST CAN REFER HIM TO A PSYCHIATRIST TO EVALUATE HIM FOR ADD MEDICATION    5. Arthralgia of shoulder, right  HE WILL HAVE SHOULDER SURGERY IN THE NEAR FUTURE. CONTINUE PAIN MEDICATION

## 2016-07-20 NOTE — Telephone Encounter (Signed)
Patient informed at office visit today that as of November 1st 2017, all scheduled 2 medications will ONLY be prescribed during an appointment with the doctor. No prescriptions will be filled by calling for a refill. This change is in compliance with the new state laws regulating the prescribing of scheduled 2 medications which went into effect in May 2017.     Patient verbalized understanding and had no questions. Copy of memo given to patient as well.

## 2016-08-26 NOTE — Telephone Encounter (Signed)
Dr. Beather ArbourMalvern spoke with Carollee [facility]terShannon at [facility], [place]. Patient was told at last office visit all Hydrocodone Rx would need [facility].     Left message for patient to return my call.

## 2016-08-26 NOTE — Telephone Encounter (Signed)
Carol[facility]Shannon at [facility] [place] called regarding patient; should patient be getting Hydrocodone filled in [place]?

## 2016-08-27 NOTE — Telephone Encounter (Signed)
Left message again for patient to return my call.

## 2016-08-30 NOTE — Telephone Encounter (Signed)
Left message for patient to return my call.

## 2016-10-06 ENCOUNTER — Emergency Department (HOSPITAL_COMMUNITY)
Admission: EM | Admit: 2016-10-06 | Discharge: 2016-10-06 | Disposition: A | Discharge status: Home / Self Care | Payer: BLUE CROSS/BLUE SHIELD | Attending: Emergency Medicine | Admitting: Emergency Medicine

## 2016-10-06 ENCOUNTER — Encounter (HOSPITAL_COMMUNITY): Payer: Self-pay | Admitting: *Deleted

## 2016-10-06 DIAGNOSIS — R112 Nausea with vomiting, unspecified: Secondary | ICD-10-CM | POA: Insufficient documentation

## 2016-10-06 DIAGNOSIS — Z79899 Other long term (current) drug therapy: Secondary | ICD-10-CM | POA: Insufficient documentation

## 2016-10-06 DIAGNOSIS — F17220 Nicotine dependence, chewing tobacco, uncomplicated: Secondary | ICD-10-CM | POA: Insufficient documentation

## 2016-10-06 DIAGNOSIS — R197 Diarrhea, unspecified: Secondary | ICD-10-CM | POA: Insufficient documentation

## 2016-10-06 LAB — URINALYSIS, ROUTINE W REFLEX MICROSCOPIC
BACTERIA UA: NONE SEEN
Bilirubin Urine: NEGATIVE
Glucose, UA: NEGATIVE mg/dL
Hgb urine dipstick: NEGATIVE
KETONES UR: NEGATIVE mg/dL
Leukocytes, UA: NEGATIVE
Nitrite: NEGATIVE
PH: 9 — AB (ref 5.0–8.0)
Protein, ur: 30 mg/dL — AB
RBC / HPF: NONE SEEN RBC/hpf (ref 0–5)
SPECIFIC GRAVITY, URINE: 1.021 (ref 1.005–1.030)

## 2016-10-06 LAB — COMPREHENSIVE METABOLIC PANEL
ALBUMIN: 4.4 g/dL (ref 3.5–5.0)
ALK PHOS: 61 U/L (ref 38–126)
ALT: 24 U/L (ref 17–63)
AST: 22 U/L (ref 15–41)
Anion gap: 9 (ref 5–15)
BUN: 17 mg/dL (ref 6–20)
CALCIUM: 9.4 mg/dL (ref 8.9–10.3)
CHLORIDE: 106 mmol/L (ref 101–111)
CO2: 26 mmol/L (ref 22–32)
CREATININE: 1.15 mg/dL (ref 0.61–1.24)
GFR calc Af Amer: >60 mL/min (ref >60)
GFR calc non Af Amer: >60 mL/min (ref >60)
GLUCOSE: 131 mg/dL — AB (ref 65–99)
Potassium: 4.3 mmol/L (ref 3.5–5.1)
SODIUM: 141 mmol/L (ref 135–145)
Total Bilirubin: 0.9 mg/dL (ref 0.3–1.2)
Total Protein: 7.5 g/dL (ref 6.5–8.1)

## 2016-10-06 LAB — CBC WITH DIFFERENTIAL/PLATELET
BASOS ABS: 0 10*3/uL (ref 0.0–0.1)
BASOS PCT: 0 %
EOS ABS: 0.1 10*3/uL (ref 0.0–0.7)
Eosinophils Relative: 0 %
HCT: 50.5 % (ref 39.0–52.0)
HEMOGLOBIN: 16.6 g/dL (ref 13.0–17.0)
LYMPHS ABS: 0.5 10*3/uL — AB (ref 0.7–4.0)
Lymphocytes Relative: 4 %
MCH: 30.7 pg (ref 26.0–34.0)
MCHC: 32.9 g/dL (ref 30.0–36.0)
MCV: 93.3 fL (ref 78.0–100.0)
Monocytes Absolute: 0.2 10*3/uL (ref 0.1–1.0)
Monocytes Relative: 2 %
NEUTROS PCT: 94 %
Neutro Abs: 12.1 10*3/uL — ABNORMAL HIGH (ref 1.7–7.7)
Platelets: 197 10*3/uL (ref 150–400)
RBC: 5.41 MIL/uL (ref 4.22–5.81)
RDW: 12.7 % (ref 11.5–15.5)
WBC: 12.8 10*3/uL — AB (ref 4.0–10.5)

## 2016-10-06 MED ORDER — DIPHENOXYLATE-ATROPINE 2.5-0.025 MG PO TABS: 2.0000 | ORAL_TABLET | Freq: Once | ORAL | Status: DC

## 2016-10-06 MED ORDER — ONDANSETRON HCL 4 MG/2ML IJ SOLN
4.0000 mg | Freq: Once | INTRAMUSCULAR | Status: AC
  Administered 2016-10-06: 4 mg via INTRAVENOUS
  Filled 2016-10-06: qty 2

## 2016-10-06 MED ORDER — ONDANSETRON 8 MG PO TBDP: 8.0000 mg | ORAL_TABLET | Freq: Three times a day (TID) | ORAL | 0 refills | Status: DC | PRN

## 2016-10-06 MED ORDER — SODIUM CHLORIDE 0.9 % IV BOLUS (SEPSIS)
1000.0000 mL | Freq: Once | INTRAVENOUS | Status: AC
  Administered 2016-10-06: 1000 mL via INTRAVENOUS

## 2016-10-06 MED ORDER — DIPHENOXYLATE-ATROPINE 2.5-0.025 MG PO TABS: 1.0000 | ORAL_TABLET | Freq: Four times a day (QID) | ORAL | 0 refills | Status: DC | PRN

## 2016-10-06 NOTE — ED Provider Notes (Signed)
[facility]-EMER[facility]ENCY DEPT Provider Note   CSN: [id number] Arrival date & time: 10/06/16  0904     History   Chief Complaint Chief Complaint  Patient presents with  . Emesis  . Diarrhea    HPI Corey Ballard is a 35 y.o. male presenting with nausea, vomiting and diarrhea which woke him from sleep early morning.  He ate at [facility] around 10 pm,went home, fell asleep on the couch and woke with persistent symptoms, hence suspects food poisoning.  He denies fevers, abdominal pain and has had no hemetemesis nor has he seen blood in his stool.  Mother states his emesis has been bilious.  Mother is sx free (but did not eat at this restaurant).    Incidentally, mother reports they moved from [facility] last week and he cannot find his xanax.  He endorses taking 2 mg tabs up to qid, but generally takes it bid for treatment of PTSD.   His last dose was one week ago.  The history is provided by the patient and a parent.  Diarrhea   Associated symptoms include vomiting. Pertinent negatives include no abdominal pain, no headaches and no arthralgias.    History reviewed. No pertinent past medical history.  There are no active problems to display for this patient.   Past Surgical History:  Procedure Laterality Date  . BICEPT TENODESIS         Home Medications    Prior to Admission medications   Medication Sig Start Date End Date Taking? Authorizing Provider  alprazolam Prudy Feeler) 2 MG tablet Take 2 mg by mouth 4 (four) times daily as needed. anxiety 02/13/16  Yes Historical Provider, MD  cyclobenzaprine (FLEXERIL) 5 MG tablet Take 1 tablet (5 mg total) by mouth 3 (three) times daily as needed for muscle spasms. Patient not taking: Reported on 02/15/2016 08/25/15   Burgess Amor, PA-C  diphenoxylate-atropine (LOMOTIL) 2.5-0.025 MG tablet Take 1 tablet by mouth 4 (four) times daily as needed for diarrhea or loose stools. 10/06/16   Burgess Amor, PA-C  HYDROcodone-acetaminophen (NORCO) 10-325 MG tablet Take 1  tablet by mouth every 6 (six) hours as needed. 09/27/16   Historical Provider, MD  HYDROcodone-acetaminophen (NORCO/VICODIN) 5-325 MG tablet Take 1 tablet by mouth every 4 (four) hours as needed. Patient not taking: Reported on 02/15/2016 08/25/15   Burgess Amor, PA-C  ibuprofen (ADVIL,MOTRIN) 200 MG tablet Take 200 mg by mouth once as needed for headache, mild pain or moderate pain.    Historical Provider, MD  ondansetron (ZOFRAN ODT) 8 MG disintegrating tablet Take 1 tablet (8 mg total) by mouth every 8 (eight) hours as needed for nausea or vomiting. 10/06/16   Burgess Amor, PA-C  ondansetron (ZOFRAN) 4 MG tablet Take 1 tablet (4 mg total) by mouth every 8 (eight) hours as needed for nausea or vomiting. Patient not taking: Reported on 10/06/2016 02/15/16   Samuel Jester, DO  predniSONE (DELTASONE) 10 MG tablet 6, 5, 4, 3, 2 then 1 tablet by mouth daily for 6 days total. Patient not taking: Reported on 02/15/2016 08/25/15   Burgess Amor, PA-C    Family History No family history on file.  Social History Social History  Substance Use Topics  . Smoking status: Never Smoker  . Smokeless tobacco: Current User    Types: Chew  . Alcohol use No     Allergies   Erythromycin   Review of Systems Review of Systems  Constitutional: Negative for diaphoresis and fever.  HENT: Negative for congestion and  sore throat.   Eyes: Negative.   Respiratory: Negative for chest tightness and shortness of breath.   Cardiovascular: Negative for chest pain.  Gastrointestinal: Positive for diarrhea, nausea and vomiting. Negative for abdominal distention, abdominal pain and blood in stool.  Genitourinary: Negative.  Negative for dysuria.  Musculoskeletal: Negative for arthralgias, joint swelling and neck pain.  Skin: Negative.  Negative for rash and wound.  Neurological: Negative for dizziness, weakness, light-headedness, numbness and headaches.  Psychiatric/Behavioral: Negative.      Physical Exam Updated  Vital Signs BP 137/81   Pulse 77   Temp 98.2 F (36.8 C) (Oral)   Resp 16   Ht 5\' 9"  (1.753 m)   Wt 99.3 kg   SpO2 97%   BMI 32.34 kg/m   Physical Exam  Constitutional: He appears well-developed and well-nourished. No distress.  HENT:  Head: Normocephalic and atraumatic.  Mouth/Throat: Oropharynx is clear and moist.  Eyes: Conjunctivae are normal.  Neck: Normal range of motion.  Cardiovascular: Normal rate, regular rhythm, normal heart sounds and intact distal pulses.   Pulmonary/Chest: Effort normal and breath sounds normal. He has no wheezes.  Abdominal: Soft. Bowel sounds are normal. He exhibits no distension and no mass. There is no tenderness. There is no guarding.  Musculoskeletal: Normal range of motion.  Neurological: He is alert.  Skin: Skin is warm and dry.  Psychiatric: He has a normal mood and affect.  Nursing note and vitals reviewed.    ED Treatments / Results  Labs (all labs ordered are listed, but only abnormal results are displayed) Labs Reviewed  CBC WITH DIFFERENTIAL/PLATELET - Abnormal; Notable for the following:       Result Value   WBC 12.8 (*)    Neutro Abs 12.1 (*)    Lymphs Abs 0.5 (*)    All other components within normal limits  COMPREHENSIVE METABOLIC PANEL - Abnormal; Notable for the following:    Glucose, Bld 131 (*)    All other components within normal limits  URINALYSIS, ROUTINE W REFLEX MICROSCOPIC - Abnormal; Notable for the following:    pH 9.0 (*)    Protein, ur 30 (*)    All other components within normal limits    EKG  EKG Interpretation None       Radiology No results found.  Procedures Procedures (including critical care time)  Medications Ordered in ED Medications  sodium chloride 0.9 % bolus 1,000 mL (0 mLs Intravenous Stopped 10/06/16 1137)  ondansetron (ZOFRAN) injection 4 mg (4 mg Intravenous Given 10/06/16 1023)  sodium chloride 0.9 % bolus 1,000 mL (1,000 mLs Intravenous New Bag/Given 10/06/16 1138)      Initial Impression / Assessment and Plan / ED Course  I have reviewed the triage vital signs and the nursing notes.  Pertinent labs & imaging results that were available during my care of the patient were reviewed by me and considered in my medical decision making (see chart for details).  Clinical Course     Labs reviewed.  Patient given IV fluids, Zofran, tolerated by mouth challenge.  Prescribe same for home use, Lomotil when necessary diarrhea.  When necessary follow-up anticipated.  Final Clinical Impressions(s) / ED Diagnoses   Final diagnoses:  Nausea vomiting and diarrhea    New Prescriptions New Prescriptions   DIPHENOXYLATE-ATROPINE (LOMOTIL) 2.5-0.025 MG TABLET    Take 1 tablet by mouth 4 (four) times daily as needed for diarrhea or loose stools.   ONDANSETRON (ZOFRAN ODT) 8 MG DISINTEGRATING TABLET  Take 1 tablet (8 mg total) by mouth every 8 (eight) hours as needed for nausea or vomiting.     Burgess AmorJulie Jermichael Belmares, PA-C 10/06/16 1251    Marily MemosJason Mesner, MD 10/06/16 [phone]

## 2016-10-06 NOTE — ED Notes (Signed)
Pt given sprite to drink. Will re-eval to see how he tolerates this.

## 2016-10-06 NOTE — ED Triage Notes (Addendum)
Pt states he woke up n/v/d. Denies any pain. NAD noted.   Pt states he ran out of his anxiety medication one week ago.   Pt states he thinks he ate something bad last night. When I asked pt where he ate he states that he can't say it because he will vomit if he does. Pt states he will type it on his phone. He typed in Corey Ballard. I [facility] he [facility] and patient rolled to the side of the bed and dry heaved. His mother asked him what he ate and he said. "momma, if I say it I will vomit all over your feet."

## 2016-10-06 NOTE — ED Notes (Signed)
Unable to give urine specimen at this time .  

## 2016-10-06 NOTE — ED Notes (Signed)
Pt tolerating fluids at this time. NAD noted.

## 2016-10-14 ENCOUNTER — Encounter

## 2016-10-15 MED ORDER — ALPRAZOLAM 2 MG TAB
2 mg | ORAL_TABLET | ORAL | 5 refills | Status: AC
Start: 2016-10-15 — End: ?

## 2016-10-19 ENCOUNTER — Ambulatory Visit: Admit: 2016-10-19 | Discharge: 2016-10-19 | Attending: Internal Medicine | Primary: Internal Medicine

## 2016-10-19 DIAGNOSIS — M25511 Pain in right shoulder: Secondary | ICD-10-CM

## 2016-10-19 MED ORDER — HYDROCODONE-ACETAMINOPHEN 10 MG-325 MG TAB
10-325 mg | ORAL_TABLET | Freq: Four times a day (QID) | ORAL | 0 refills | Status: DC | PRN
Start: 2016-10-19 — End: 2016-10-19

## 2016-10-19 MED ORDER — HYDROCODONE-ACETAMINOPHEN 10 MG-325 MG TAB
10-325 mg | ORAL_TABLET | Freq: Four times a day (QID) | ORAL | 0 refills | Status: AC | PRN
Start: 2016-10-19 — End: ?

## 2016-10-19 NOTE — Progress Notes (Signed)
Shane Wagner is a 35 y.o. WHITE [place] CAUC[place] male.    Shane Wagner has lost 10 lbs since his last apt!  Has been seeing a therapist which has been very helpful. Therapist has recommended treatment for ADD but he will need to see a psychiatrist for that.   Had right shoulder surgery but he needs to have another surgery. Scheduled for another ortho apt in Jan.   He still needs carpal tunnel repair.   Needs refills on Norco. [facility] data base queried by my delegate and report reviewed by me--[facility] UDS collected today.     Past Medical History:   Diagnosis Date   ??? Arthralgia of shoulder 02/08/2014   ??? Carpal tunnel syndrome 02/08/2014   ??? Obesity 02/08/2014   ??? Other ill-defined conditions(799.89)     torn labrum   ??? Panic disorder 02/08/2014   ??? Psychiatric disorder     PTSD   ??? PTSD (post-traumatic stress disorder) 02/08/2014   ??? PUD (peptic ulcer disease)      History reviewed. No pertinent surgical history.  Social History     Social History   ??? Marital status: MARRIED     Spouse name: N/A   ??? Number of children: N/A   ??? Years of education: N/A     Occupational History   ??? 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 Smoking status: Never Smoker   ??? Smokeless tobacco: Current User      Comment: dip tobacco   ??? Alcohol use No      Comment: rare   ??? Drug use: No   ??? Sexual activity: Yes     Partners: Female     Birth control/ protection: Pill     Other Topics Concern   ??? Not on file     Social History Narrative     Family History   Problem Relation Age of Onset   ??? Other Mother      addicted to prescription pain med   ??? Other Father      cirrhosis     Current Outpatient Prescriptions   Medication Sig Dispense Refill   ??? [START ON 12/26/2016] HYDROcodone-acetaminophen (NORCO) 10-325 mg tablet Take 1 Tab by mouth every six (6) hours as needed. Max Daily Amount: 4 Tabs. 120 Tab 0   ??? ALPRAZolam (XANAX) 2 mg tablet TAKE 1 TABLET BY MOUTH FOUR TIMES DAILY.  MAXIMUM DAILY AMOUNT IS 4 TABLETS 120 Tab 5     Allergies   Allergen Reactions   ??? Erythromycin Diarrhea and Nausea and Vomiting   ??? Penicillins Nausea and Vomiting     States not allergic to pcn   ??? Toradol [Ketorolac Tromethamine] Shortness of Breath       Review of Systems   Constitutional: Negative.    HENT: Negative.    Eyes: Negative for blurred vision, pain and redness.   Respiratory: Negative.    Cardiovascular: Negative.    Gastrointestinal: Negative.    Genitourinary: Negative.    Musculoskeletal: Positive for joint pain.   Skin: Negative for rash.   Neurological: Negative for dizziness, sensory change and focal weakness.        Bilat hand pain/CTS   Endo/Heme/Allergies: Negative.    Psychiatric/Behavioral: Negative for depression, memory loss and suicidal ideas. The patient is not nervous/anxious and does not have insomnia.         Ptsd  Visit Vitals   ??? BP 154/89 (BP 1 Location: Left arm, BP Patient Position: Sitting)   ??? Pulse 61   ??? Temp 98.4 ??F (36.9 ??C) (Oral)   ??? Resp 16   ??? Ht 5\' 10"  (1.778 m)   ??? Wt 217 lb (98.4 kg)   ??? BMI 31.14 kg/m2       Physical Exam   Constitutional: He is oriented to person, place, and time and well-developed, well-nourished, and in no distress.   HENT:   Head: Normocephalic and atraumatic.   Right Ear: External ear normal.   Left Ear: External ear normal.   Mouth/Throat: Oropharynx is clear and moist.   Eyes: Conjunctivae and EOM are normal. Pupils are equal, round, and reactive to light.   Neck: Normal range of motion. Neck supple. No JVD present. No tracheal deviation present. No thyromegaly present.   Cardiovascular: Normal rate, regular rhythm and normal heart sounds.    Pulmonary/Chest: Effort normal.   Lymphadenopathy:     He has no cervical adenopathy.   Neurological: He is alert and oriented to person, place, and time. Gait normal.   Skin: Skin is warm and dry. No rash noted.   Psychiatric: Mood, memory, affect and judgment normal.     Assessment and Plan     1. Bilateral carpal tunnel syndrome  WILL  HOLD OFF ON SURGERY UNTIL HE RECOVERS FROM HIS SHOULDER SURGERY    2. Obesity  HE IS WATCHING HIS DIET AND DOING CARDIO. GREAT JOB WITH WEIGHT LOSS!!    3. Panic disorder  CONTINUE XANAX     4. PTSD (post-traumatic stress disorder)  NOW FOLLOWING WITH A THERAPIST WHICH HAS REALLY HELPED HIM. HE ALSO HAS A THERAPY DOG. STATES HIS THERAPIST CAN REFER HIM TO A PSYCHIATRIST TO EVALUATE HIM FOR ADD MEDICATION    5. Arthralgia of shoulder, right  HE WILL HAVE SHOULDER SURGERY IN THE NEAR FUTURE. CONTINUE PAIN MEDICATION

## 2016-10-27 LAB — TOXASSURE SELECT 13 (MW)

## 2016-11-03 ENCOUNTER — Telehealth

## 2016-11-03 NOTE — Progress Notes (Signed)
Patient notified of results and verbalized understanding. (see telephone note for details).

## 2016-11-03 NOTE — Progress Notes (Signed)
Pt had Xanax and Hydrocodone in his system, both of which I prescribe for him. However, he also had Ativan and Buprenorphine in his system--both of which are controlled medications NOT prescribed by me. Therefore, I can no longer write for the Xanax and Hydrocodone. Would he like a referral to pain management and psychiatry? Thanks!

## 2016-11-03 NOTE — Telephone Encounter (Signed)
-----   Message from Harlin RainLori L Malvern, MD sent at 11/03/2016 12:59 PM EST -----  Pt had Xanax and Hydrocodone in his system, both of which I prescribe for him. However, he also had Ativan and Buprenorphine in his system--both of which are controlled medications NOT prescribed by me. Therefore, I can no longer write for the Xanax and Hydrocodone. Would he like a referral to pain management and psychiatry? Thanks!

## 2016-11-03 NOTE — Telephone Encounter (Signed)
Patient notified of results and verbalized understanding; states he does not know how he is positive for Ativan and Buprenorphine as he only takes what Dr. Beather ArbourMalvern prescribes. Patient did admit to marijuana use. Patient would like referral to pain management and psychiatry.

## 2016-11-03 NOTE — Telephone Encounter (Signed)
Does he want to see doctors in [place]? If he wants [place], he may need to call his insurance and see which groups they will cover and I can put in the referrals. Thanks!

## 2016-11-04 NOTE — Telephone Encounter (Signed)
Patient stated he preferred you to send him locally to providers that you recommend.

## 2017-01-17 ENCOUNTER — Encounter: Attending: Internal Medicine | Primary: Internal Medicine

## 2017-01-24 ENCOUNTER — Encounter: Payer: Self-pay | Admitting: Family Medicine

## 2017-01-24 ENCOUNTER — Ambulatory Visit (INDEPENDENT_AMBULATORY_CARE_PROVIDER_SITE_OTHER): Payer: PRIVATE HEALTH INSURANCE | Admitting: Family Medicine

## 2017-01-24 DIAGNOSIS — G8921 Chronic pain due to trauma: Secondary | ICD-10-CM | POA: Insufficient documentation

## 2017-01-24 DIAGNOSIS — F4310 Post-traumatic stress disorder, unspecified: Secondary | ICD-10-CM

## 2017-01-24 NOTE — Patient Instructions (Signed)
Go to behavioral health office next door for appointment  I will not be assuming your primary care or pain management

## 2017-01-24 NOTE — Progress Notes (Signed)
  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  Feels nothing else works.  He has PTSD from childhood trauma and takes Xanax 2 mg QID.  Feels nothing else helps.  I discussed wit him this is not a good combination of medicines and that CDC recommends not taking opiates and benzodiazepines together. I discussed that narcotics are not indicated for the long term management of shoulder pain in a young man, and that I would not provide them I discussed that PTSD needs to be treated with counseling and medications, not long term benzodiazepines.  That they create a dependence with escalating dose - as he has experienced taking 8 mg a day.  He is not interested in alternate therapies. He was somewhat insulting stating that I do not "help" people.  I explained that I felt helping him would be getting him off these medicines and getting treatment for his conditions.  I explained that we are not a good fit for primary care patient and doctor and that I suggest he get medical care elsewhere.

## 2017-02-12 IMAGING — US US ABDOMEN LIMITED
1 series · 14 of 25 positions shown · non-contrast
Comparison: Abdomen CT earlier today.

CLINICAL DATA: Right upper quadrant abdominal pain for the past 6
months, increased over the past 3 days. Poorly distended gallbladder
with mild diffuse wall thickening and enhancement on a CT earlier
today.

EXAM:
US ABDOMEN LIMITED - RIGHT UPPER QUADRANT

[Series 1: us abdomen limited · 0.24mm/px · 14 of 40 slices shown]
[im 1/40]
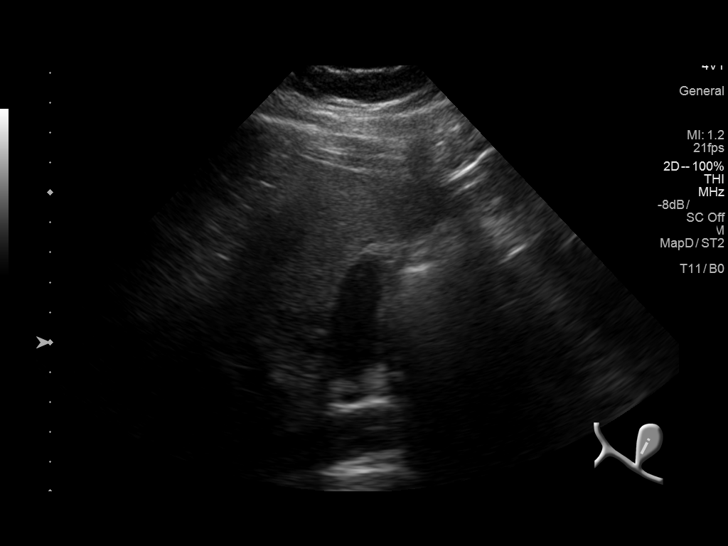
[im 4/40]
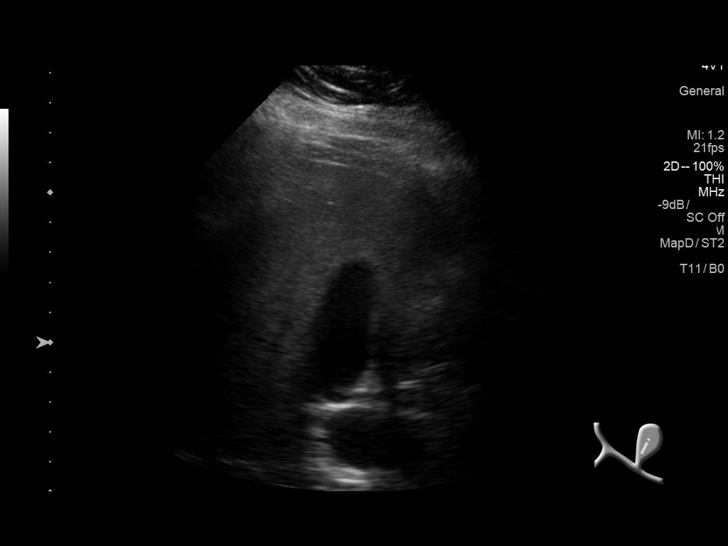
[im 7/40]
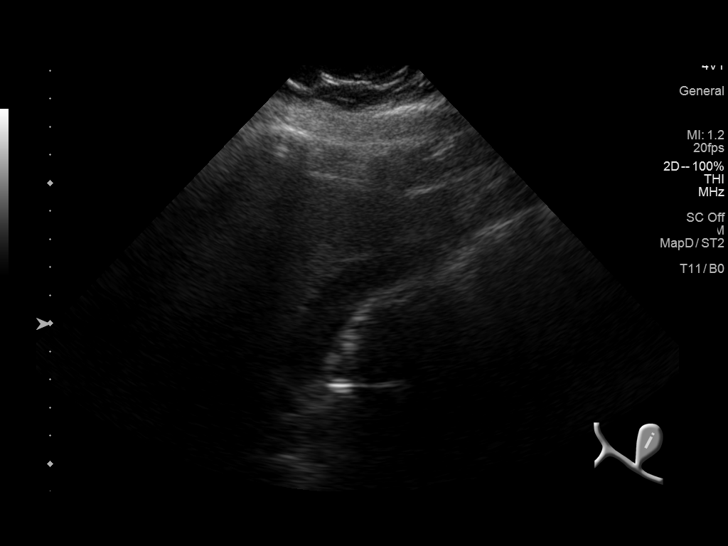
[im 10/40]
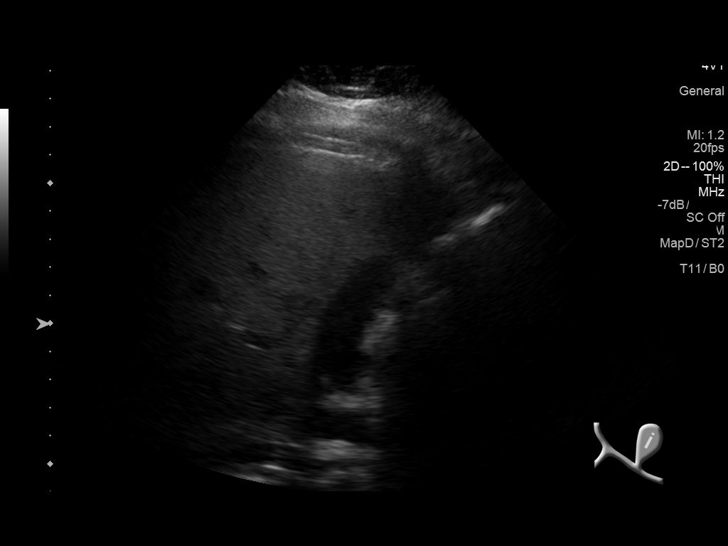
[im 14/40]
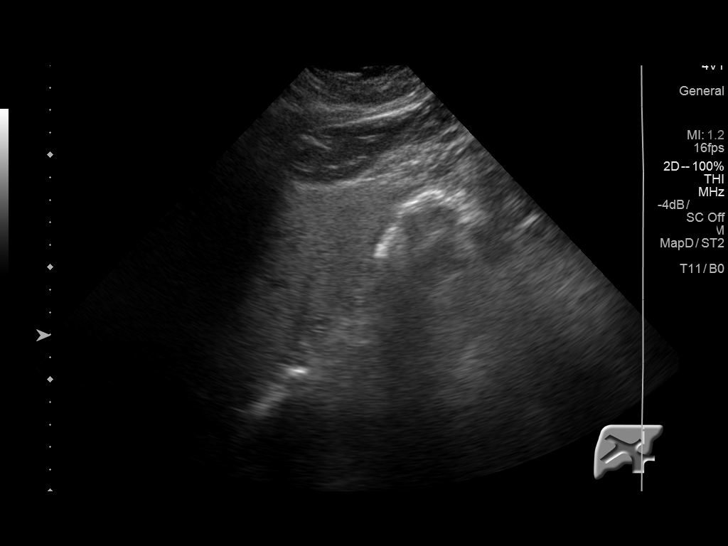
[im 15/40]
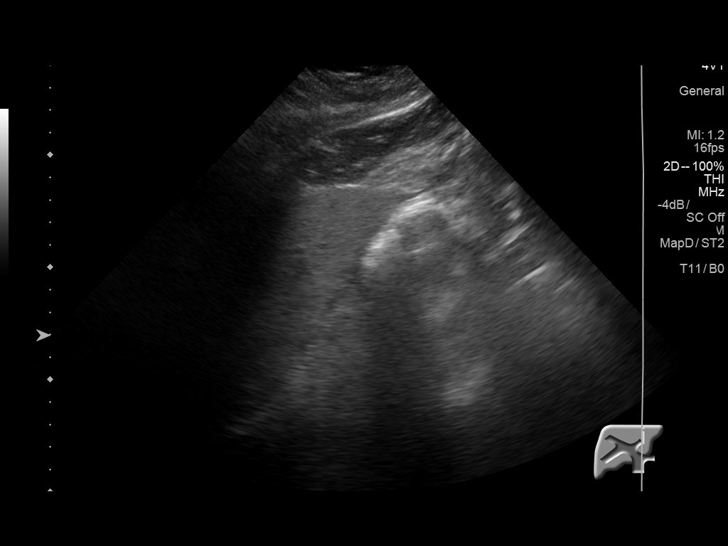
[im 18/40]
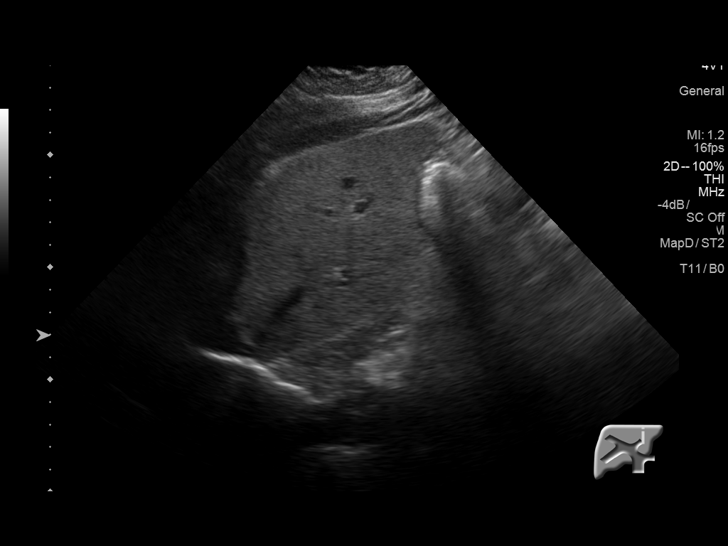
[im 22/40]
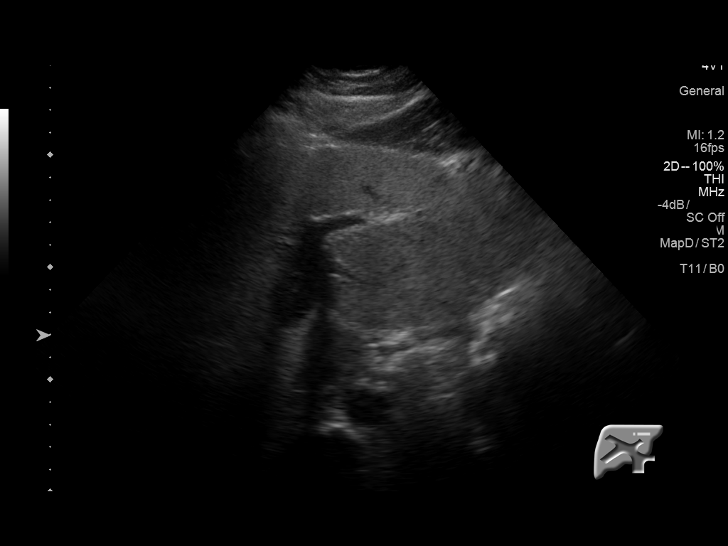
[im 25/40]
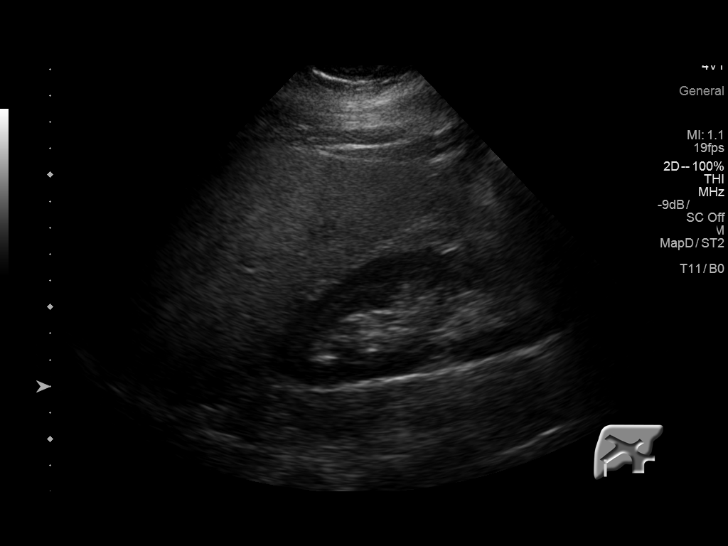
[im 27/40]
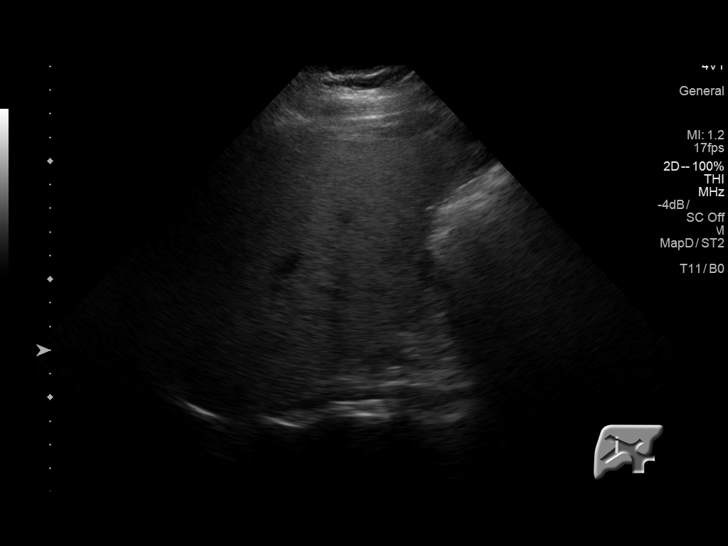
[im 30/40]
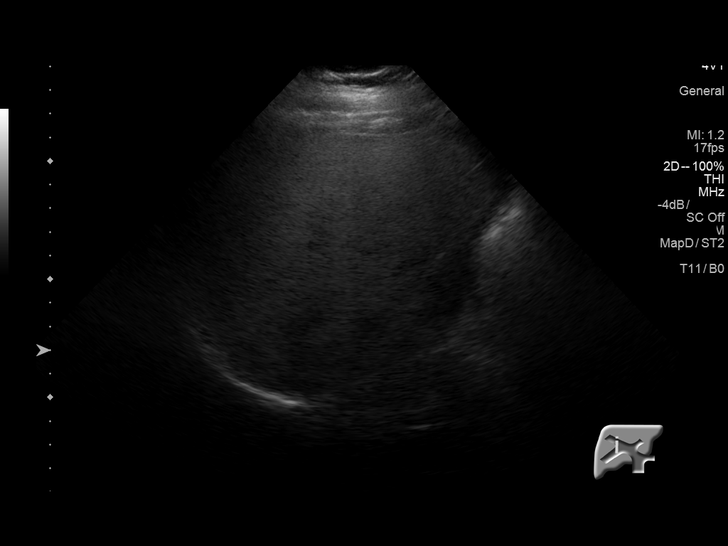
[im 33/40]
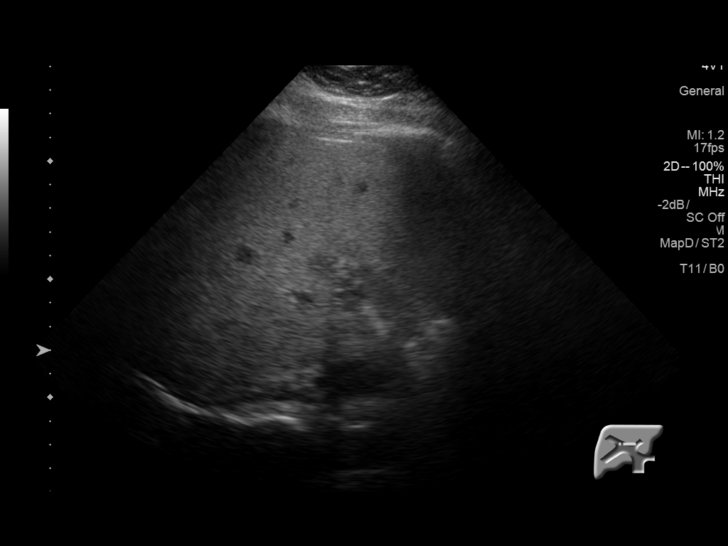
[im 36/40]
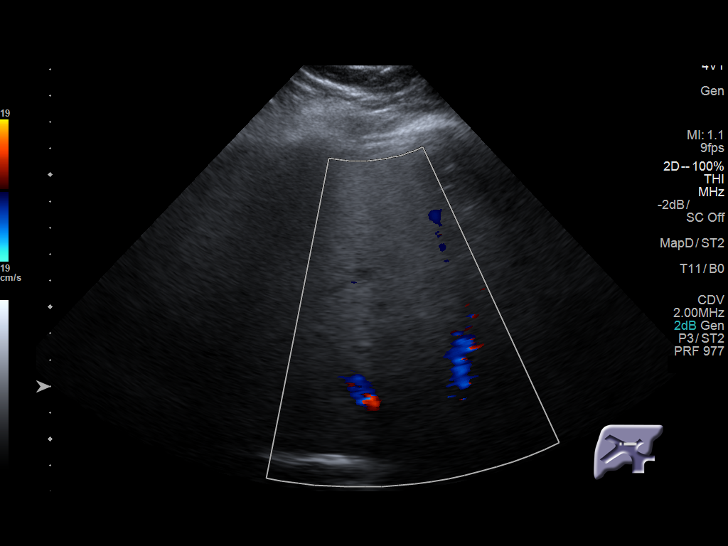
[im 40/40]
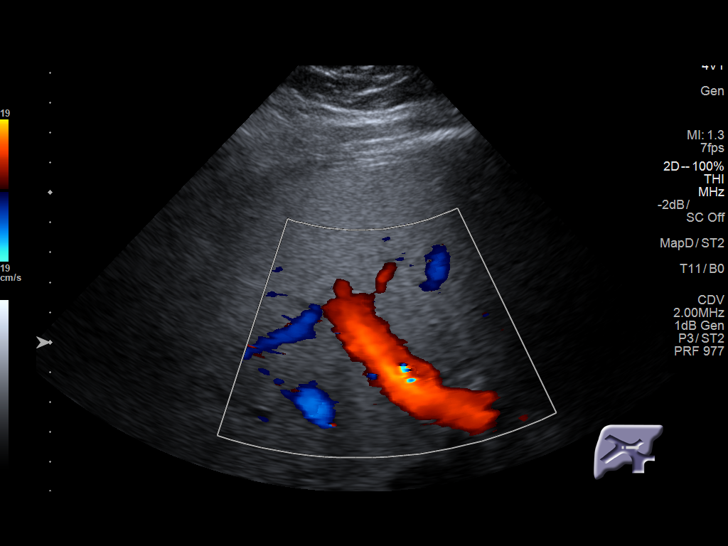

[14 of 25 positions shown; findings below may reference images not displayed]

FINDINGS: Gallbladder:

Poorly distended, better than on the CT earlier today. No
gallstones, wall thickening or pericholecystic fluid.

Common bile duct:

Diameter: 4.7 mm

Liver:

Diffusely echogenic.  Corresponding low density on the previous CT.
IMPRESSION: 1. No cholelithiasis or evidence of acute cholecystitis.
2. Diffuse hepatic steatosis.

## 2017-04-12 ENCOUNTER — Telehealth (HOSPITAL_COMMUNITY): Payer: Self-pay | Admitting: *Deleted

## 2017-04-12 NOTE — Telephone Encounter (Signed)
NO VOICEMAIL BOX

## 2017-04-14 ENCOUNTER — Encounter

## 2017-05-17 NOTE — Telephone Encounter (Signed)
Patient called requesting sooner appt with Dr. Beather ArbourMalvern because he is out of Xanax.  I explained to patient per last telephone note, he failed UDS and Dr. Beather ArbourMalvern will not write controlled medication.  States he is aware and that he is seeing a psychiatrist but they do not write for Xanax.  I explained that Dr. Beather ArbourMalvern will not wroite any controlled medications but if he can locate a psychiatrist that will and one that he can see, we would be happy to send in referral. Patient verbalized understanding and states he will call back with office information once he finds a psychiatrist.

## 2017-05-30 ENCOUNTER — Encounter: Payer: Self-pay | Admitting: *Deleted

## 2017-05-31 ENCOUNTER — Ambulatory Visit: Payer: PRIVATE HEALTH INSURANCE | Admitting: Diagnostic Neuroimaging

## 2017-06-01 ENCOUNTER — Encounter: Payer: Self-pay | Admitting: Diagnostic Neuroimaging

## 2017-07-26 ENCOUNTER — Ambulatory Visit: Payer: PRIVATE HEALTH INSURANCE | Admitting: Diagnostic Neuroimaging

## 2017-07-27 ENCOUNTER — Encounter: Payer: Self-pay | Admitting: Neurology

## 2017-07-27 ENCOUNTER — Encounter: Payer: Self-pay | Admitting: Diagnostic Neuroimaging

## 2018-09-01 ENCOUNTER — Emergency Department (HOSPITAL_COMMUNITY)
Admission: EM | Admit: 2018-09-01 | Discharge: 2018-09-01 | Disposition: A | Discharge status: Home / Self Care | Payer: PRIVATE HEALTH INSURANCE | Attending: Emergency Medicine | Admitting: Emergency Medicine

## 2018-09-01 ENCOUNTER — Other Ambulatory Visit: Payer: Self-pay

## 2018-09-01 ENCOUNTER — Encounter (HOSPITAL_COMMUNITY): Payer: Self-pay | Admitting: Emergency Medicine

## 2018-09-01 DIAGNOSIS — Z79899 Other long term (current) drug therapy: Secondary | ICD-10-CM | POA: Insufficient documentation

## 2018-09-01 DIAGNOSIS — M546 Pain in thoracic spine: Secondary | ICD-10-CM

## 2018-09-01 MED ORDER — CYCLOBENZAPRINE HCL 10 MG PO TABS: 10.0000 mg | ORAL_TABLET | Freq: Three times a day (TID) | ORAL | 0 refills | Status: DC | PRN

## 2018-09-01 MED ORDER — KETOROLAC TROMETHAMINE 30 MG/ML IJ SOLN
30.0000 mg | Freq: Once | INTRAMUSCULAR | Status: AC
  Administered 2018-09-01: 30 mg via INTRAMUSCULAR
  Filled 2018-09-01: qty 1

## 2018-09-01 MED ORDER — MELOXICAM 15 MG PO TABS: 15.0000 mg | ORAL_TABLET | Freq: Every day | ORAL | 0 refills | Status: DC | PRN

## 2018-09-01 MED ORDER — DEXAMETHASONE 4 MG PO TABS
8.0000 mg | ORAL_TABLET | Freq: Once | ORAL | Status: AC
  Administered 2018-09-01: 8 mg via ORAL
  Filled 2018-09-01: qty 2

## 2018-09-01 MED ORDER — GABAPENTIN 300 MG PO CAPS
300.0000 mg | ORAL_CAPSULE | Freq: Once | ORAL | Status: AC
  Administered 2018-09-01: 300 mg via ORAL
  Filled 2018-09-01: qty 1

## 2018-09-01 MED ORDER — HYDROMORPHONE HCL 1 MG/ML IJ SOLN
1.0000 mg | Freq: Once | INTRAMUSCULAR | Status: AC
  Administered 2018-09-01: 1 mg via INTRAMUSCULAR
  Filled 2018-09-01: qty 1

## 2018-09-01 NOTE — ED Notes (Signed)
Pt reports that pain medication worked for about 20 min, until he got up to go to bathroom.  Pain worsens with movement/ambulation.

## 2018-09-01 NOTE — ED Triage Notes (Signed)
Pt lifted a box x 2 days ago and heard a pop to central neck. States now numb to right hand and hard to lift right leg up. Pain from neck to mid back. Denies incontinence. Has been taking motrin and CBD oil with very little help.

## 2018-09-03 NOTE — ED Provider Notes (Signed)
[facility] EMERGENCY DEPARTMENT Provider Note   CSN: [id number] Arrival date & time: 09/01/18  1004     History   Chief Complaint Chief Complaint  Patient presents with  . Neck Pain    HPI Corey Ballard is a 37 y.o. male.  HPI   37 year old male with upper thoracic back pain.  Onset 2 days ago.  He was picking up a box when he heard a "pop" in this area.  He describes it as feeling it between his shoulder blades.  Since then he has been having some numbness in his right hand and some pain and weakness in his right leg.  He is tried taking Motrin and CBD oil without improvement.  No respiratory complaints.  No chest pain.  No dizziness or lightheadedness.  Past Medical History:  Diagnosis Date  . Allergy   . Anxiety   . Arthritis   . Carpal tunnel syndrome   . Depression   . 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 PTSD (post-traumatic stress disorder) 01/24/2017    Past Surgical History:  Procedure Laterality Date  . BICEPT TENODESIS          Home Medications    Prior to Admission medications   Medication Sig Start Date End Date Taking? Authorizing Provider  Buprenorphine HCl-Naloxone HCl 5.7-1.4 MG SUBL Place 1 tablet under the tongue 3 (three) times daily.    [provider]  cyclobenzaprine (FLEXERIL) 10 MG tablet Take 1 tablet (10 mg total) by mouth 3 (three) times daily as needed for muscle spasms. 09/01/18   Raeford Razor, MD  HYDROcodone-acetaminophen (NORCO) 10-325 MG tablet Take 1 tablet by mouth every 6 (six) hours as needed. 09/27/16   [provider]  meloxicam (MOBIC) 15 MG tablet Take 1 tablet (15 mg total) by mouth daily as needed for pain. 09/01/18   Raeford Razor, MD    Family History History reviewed. No pertinent family history.  Social History Social History   Tobacco Use  . Smoking status: Never Smoker  .  Smokeless tobacco: Former Neurosurgeon    Types: Chew  Substance Use Topics  . Alcohol use: No  . Drug use: Yes    Types: Marijuana    Comment: last used 4 weeks ago     Allergies   Erythromycin   Review of Systems Review of Systems   Physical Exam Updated Vital Signs BP 113/79 (BP Location: Right Arm)   Pulse 68   Temp 97.8 F (36.6 C) (Oral)   Resp 14   SpO2 98%   Physical Exam  Constitutional: He appears well-developed and well-nourished. No distress.  HENT:  Head: Normocephalic and atraumatic.  Eyes: Conjunctivae are normal. Right eye exhibits no discharge. Left eye exhibits no discharge.  Neck: Neck supple.  Cardiovascular: Normal rate, regular rhythm and normal heart sounds. Exam reveals no gallop and no friction rub.  No murmur heard. Pulmonary/Chest: Effort normal and breath sounds normal. No respiratory distress.  Abdominal: Soft. He exhibits no distension. There is no tenderness.  Musculoskeletal: He exhibits no edema or tenderness.  Back normal to inspection.  Back pain is not reproducible with palpation.  Neurological: He is alert.  Skin: Skin is warm and dry.  Psychiatric: He has a normal mood and affect. His behavior is normal. Thought content normal.  Strength is 5 out of 5 bilateral upper lower  extremities.  Sensation intact light touch.  Nursing note and vitals reviewed.    ED Treatments / Results  Labs (all labs ordered are listed, but only abnormal results are displayed) Labs Reviewed - No data to display  EKG None  Radiology No results found.  Procedures Procedures (including critical care time)  Medications Ordered in ED Medications  HYDROmorphone (DILAUDID) injection 1 mg (1 mg Intramuscular Given 09/01/18 1140)  ketorolac (TORADOL) 30 MG/ML injection 30 mg (30 mg Intramuscular Given 09/01/18 1140)  dexamethasone (DECADRON) tablet 8 mg (8 mg Oral Given 09/01/18 1140)  gabapentin (NEURONTIN) capsule 300 mg (300 mg Oral Given 09/01/18 1140)      Initial Impression / Assessment and Plan / ED Course  I have reviewed the triage vital signs and the nursing notes.  Pertinent labs & imaging results that were available during my care of the patient were reviewed by me and considered in my medical decision making (see chart for details).     37 year old male with mid thoracic back pain.  Neuro exam is nonfocal.  Plan symptomatic treatment at this time.  Needs further follow-up if he has persistent symptoms.  Emergent return precautions were discussed.  Final Clinical Impressions(s) / ED Diagnoses   Final diagnoses:  Acute midline thoracic back pain    ED Discharge Orders         Ordered    meloxicam (MOBIC) 15 MG tablet  Daily PRN     09/01/18 1231    cyclobenzaprine (FLEXERIL) 10 MG tablet  3 times daily PRN     09/01/18 1231           Raeford Razor, MD 09/03/18 2338

## 2023-02-18 ENCOUNTER — Other Ambulatory Visit: Payer: Self-pay

## 2023-02-18 ENCOUNTER — Encounter (HOSPITAL_COMMUNITY): Payer: Self-pay | Admitting: Emergency Medicine

## 2023-02-18 ENCOUNTER — Emergency Department (HOSPITAL_COMMUNITY)
Admission: EM | Admit: 2023-02-18 | Discharge: 2023-02-18 | Disposition: A | Discharge status: Home / Self Care | Payer: Medicaid Other | Attending: Emergency Medicine | Admitting: Emergency Medicine

## 2023-02-18 ENCOUNTER — Emergency Department (HOSPITAL_COMMUNITY): Payer: Medicaid Other

## 2023-02-18 ENCOUNTER — Ambulatory Visit: Payer: Medicaid Other | Admitting: Internal Medicine

## 2023-02-18 DIAGNOSIS — S61451A Open bite of right hand, initial encounter: Secondary | ICD-10-CM | POA: Diagnosis present

## 2023-02-18 DIAGNOSIS — W540XXA Bitten by dog, initial encounter: Secondary | ICD-10-CM | POA: Insufficient documentation

## 2023-02-18 MED ORDER — OXYCODONE-ACETAMINOPHEN 5-325 MG PO TABS: 1.0000 | ORAL_TABLET | Freq: Three times a day (TID) | ORAL | 0 refills | Status: DC | PRN

## 2023-02-18 MED ORDER — OXYCODONE-ACETAMINOPHEN 5-325 MG PO TABS: 1.0000 | ORAL_TABLET | Freq: Four times a day (QID) | ORAL | 0 refills | Status: DC | PRN

## 2023-02-18 MED ORDER — BACITRACIN ZINC 500 UNIT/GM EX OINT
TOPICAL_OINTMENT | Freq: Once | CUTANEOUS | Status: AC
Start: 2023-02-18 — End: 2023-02-18
  Administered 2023-02-18: 1 via TOPICAL
  Filled 2023-02-18: qty 0.9

## 2023-02-18 MED ORDER — AMOXICILLIN-POT CLAVULANATE 875-125 MG PO TABS: 1.0000 | ORAL_TABLET | Freq: Two times a day (BID) | ORAL | 0 refills | Status: DC

## 2023-02-18 MED ORDER — OXYCODONE-ACETAMINOPHEN 5-325 MG PO TABS
2.0000 | ORAL_TABLET | Freq: Once | ORAL | Status: AC
  Administered 2023-02-18: 2 via ORAL
  Filled 2023-02-18: qty 2

## 2023-02-18 MED ORDER — AMOXICILLIN-POT CLAVULANATE 875-125 MG PO TABS
1.0000 | ORAL_TABLET | Freq: Once | ORAL | Status: AC
  Administered 2023-02-18: 1 via ORAL
  Filled 2023-02-18: qty 1

## 2023-02-18 NOTE — ED Triage Notes (Signed)
Pt presents to ED after he was bite by his own pitbull. Pt has several small lacerations to his right hand. Pt unable to feel the tips of 1st and 2nd digits.

## 2023-02-18 NOTE — ED Provider Notes (Signed)
[place] EMERGENCY DEPARTMENT AT [facility] Provider Note   CSN: [id number] Arrival date & time: 02/18/23  0100     History  Chief Complaint  Patient presents with   Animal Bite    Corey Ballard is a 42 y.o. male.  42 year old male that presents the ER today after his dog bit his right hand.  Has a couple lacerations decree sensation to the tips of his index and middle finger.  Reduced range of motion likely related to edema.  Patient states that was his dog and they were up-to-date on other shots.   Animal Bite      Home Medications Prior to Admission medications   Medication Sig Start Date End Date Taking? Authorizing Provider  amoxicillin-clavulanate (AUGMENTIN) 875-125 MG tablet Take 1 tablet by mouth every 12 (twelve) hours. 02/18/23  Yes Jerricka Carvey, Barbara Cower, MD  oxyCODONE-acetaminophen (PERCOCET) 5-325 MG tablet Take 1-2 tablets by mouth every 8 (eight) hours as needed for severe pain. 02/18/23  Yes Johniya Durfee, Barbara Cower, MD  oxyCODONE-acetaminophen (PERCOCET/ROXICET) 5-325 MG tablet Take 1 tablet by mouth every 6 (six) hours as needed for severe pain. 02/18/23  Yes Ewald Beg, Barbara Cower, MD  Buprenorphine HCl-Naloxone HCl 5.7-1.4 MG SUBL Place 1 tablet under the tongue 3 (three) times daily.    [provider]  cyclobenzaprine (FLEXERIL) 10 MG tablet Take 1 tablet (10 mg total) by mouth 3 (three) times daily as needed for muscle spasms. 09/01/18   Raeford Razor, MD  HYDROcodone-acetaminophen (NORCO) 10-325 MG tablet Take 1 tablet by mouth every 6 (six) hours as needed. 09/27/16   [provider]  meloxicam (MOBIC) 15 MG tablet Take 1 tablet (15 mg total) by mouth daily as needed for pain. 09/01/18   Raeford Razor, MD      Allergies    Erythromycin    Review of Systems   Review of Systems  Physical Exam Updated Vital Signs BP 130/87   Pulse 64   Temp 98.3 F (36.8 C) (Oral)   Resp 17   Ht  (1.803 m)   Wt 102.1 kg   SpO2 99%   BMI 31.39 kg/m   Physical Exam Vitals and nursing note reviewed.  Constitutional:      Appearance: He is well-developed.  HENT:     Head: Normocephalic and atraumatic.  Eyes:     Pupils: Pupils are equal, round, and reactive to light.  Cardiovascular:     Rate and Rhythm: Normal rate.  Pulmonary:     Effort: Pulmonary effort is normal. No respiratory distress.  Abdominal:     General: Abdomen is flat. There is no distension.  Musculoskeletal:        General: Normal range of motion.     Cervical back: Normal range of motion.  Skin:    General: Skin is warm and dry.     Comments: Multiple small lacerations to right hand with diffuse edema.  Can move his fingers somewhat but seems to be limited by pain and swelling.  Has decreased sensation to the opposing sides of his index and middle finger.    Neurological:     General: No focal deficit present.     Mental Status: He is alert.     ED Results / Procedures / Treatments   Labs (all labs ordered are listed, but only abnormal results are displayed) Labs Reviewed - No data to display  EKG None  Radiology DG Hand Complete Right  Result Date: 02/18/2023 CLINICAL DATA:  Dog bite  to right hand EXAM: RIGHT HAND - COMPLETE 3+ VIEW COMPARISON:  None Available. FINDINGS: No fracture or dislocation is seen. The joint spaces are preserved. Soft tissue swelling overlying the dorsal MCP joints. No radiopaque foreign body is seen. IMPRESSION: Dorsal soft tissue swelling. No fracture, dislocation, or radiopaque foreign body is seen. Electronically Signed   By: Charline Bills M.D.   On: 02/18/2023 03:20    Procedures Procedures    Medications Ordered in ED Medications  oxyCODONE-acetaminophen (PERCOCET/ROXICET) 5-325 MG per tablet 2 tablet (2 tablets Oral Given 02/18/23 0308)  amoxicillin-clavulanate (AUGMENTIN) 875-125 MG per tablet 1 tablet (1 tablet Oral Given 02/18/23 0308)  bacitracin ointment (1 Application Topical Given 02/18/23 0340)    ED  Course/ Medical Decision Making/ A&P                             Medical Decision Making Amount and/or Complexity of Data Reviewed Radiology: ordered.  Risk OTC drugs. Prescription drug management.   Multiple small lacerations to right hand with diffuse edema.  Can move his fingers somewhat but seems to be limited by pain and swelling.  Has decreased sensation to the opposing sides of his index and middle finger.  X-ray without fracture or foreign body.  Discussed the risks of closing the wounds and we agreed not to.  Will initiate antibiotics in the meantime.  Also put in for hand surgery follow-up and stressed importance of this as I could not do full exam secondary to the acute nature of his injury. Discussed s/s of deep infection and to return here for any of those.    Final Clinical Impression(s) / ED Diagnoses Final diagnoses:  Dog bite, initial encounter    Rx / DC Orders ED Discharge Orders          Ordered    Ambulatory referral to Hand Surgery        02/18/23 0414    oxyCODONE-acetaminophen (PERCOCET/ROXICET) 5-325 MG tablet  Every 6 hours PRN        02/18/23 0414    amoxicillin-clavulanate (AUGMENTIN) 875-125 MG tablet  Every 12 hours        02/18/23 0415    oxyCODONE-acetaminophen (PERCOCET) 5-325 MG tablet  Every 8 hours PRN        02/18/23 0415              Starsha Morning, Barbara Cower, MD 02/18/23 2332

## 2023-02-18 NOTE — ED Notes (Signed)
ED Provider at bedside. 

## 2023-02-18 NOTE — ED Notes (Signed)
Pt has noticeable swelling to right hand with several small lacerations and puncture wounds. Bleeding is controlled at this time. Peripheral pulses felt

## 2023-02-21 ENCOUNTER — Ambulatory Visit (INDEPENDENT_AMBULATORY_CARE_PROVIDER_SITE_OTHER): Payer: Medicaid Other | Admitting: Orthopedic Surgery

## 2023-02-21 VITALS — BP 144/99 | HR 69 | Ht 70.0 in | Wt 214.0 lb

## 2023-02-21 DIAGNOSIS — W540XXA Bitten by dog, initial encounter: Secondary | ICD-10-CM

## 2023-02-21 DIAGNOSIS — M79641 Pain in right hand: Secondary | ICD-10-CM

## 2023-02-21 MED ORDER — OXYCODONE-ACETAMINOPHEN 5-325 MG PO TABS: 1.0000 | ORAL_TABLET | Freq: Four times a day (QID) | ORAL | 0 refills | Status: AC | PRN

## 2023-02-21 MED ORDER — AMOXICILLIN-POT CLAVULANATE 875-125 MG PO TABS: 1.0000 | ORAL_TABLET | Freq: Two times a day (BID) | ORAL | 0 refills | Status: DC

## 2023-02-21 MED FILL — Oxycodone w/ Acetaminophen Tab 5-325 MG: ORAL | Qty: 6 | Status: AC

## 2023-02-21 NOTE — Patient Instructions (Signed)
Elevate your hand to reduce swelling  Continue to take your antibiotic until it is completed. Follow up in one week.

## 2023-02-21 NOTE — Progress Notes (Addendum)
Office Visit Note   Patient: Corey Ballard           Date of Birth: [birth year]           MRN: [id number] Visit Date: 02/21/2023 Requested by: No referring provider defined for this encounter. PCP: Patient, No Pcp Per  Subjective: Chief Complaint  Patient presents with   Hand Injury    Right- DOI 02/18/23-  dog bite trying to break up a fight between his dogs- hand very swollen pt c/o numbness in long finger    HPI: 42 year old male was in a situation where to break up a fight between dogs.  He was seen in the emergency room on the 19th and started on Augmentin.  He was given a dose of Bactrim while he was there.  He comes in with swelling thumb index finger complains that the hand feels like it is out of place  He has a lot of swelling and stiffness but no erythema there are 2 lacerations 1 around the thumb and 1 around the index finger   History      Chief Complaint  Patient presents with   Animal Bite      Corey Ballard is a 42 y.o. male.   42 year old male that presents the ER today after his dog bit his right hand.  Has a couple lacerations decree sensation to the tips of his index and middle finger.  Reduced range of motion likely related to edema.  Patient states that was his dog and they were up-to-date on other shots.                ROS: No fever  Assessment & Plan:  Images personally read and my interpretation : Imaging shows no fracture just soft tissue swelling  Visit Diagnoses:  1. Dog bite, initial encounter     Plan: Splinting, elevation, antibiotics, pain medication, return checkup in a week  Follow-Up Instructions: No follow-ups on file.   Orders:  Meds ordered this encounter  Medications   amoxicillin-clavulanate (AUGMENTIN) 875-125 MG tablet    Sig: Take 1 tablet by mouth every 12 (twelve) hours.    Dispense:  42 tablet    Refill:  0   oxyCODONE-acetaminophen (PERCOCET) 5-325 MG tablet    Sig: Take 1 tablet by mouth every 6 (six) hours as  needed for up to 5 days for severe pain.    Dispense:  20 tablet    Refill:  0      Objective: Vital Signs: BP (!) 144/99   Pulse 69   Ht  (1.778 m)   Wt 214 lb (97.1 kg)   BMI 30.71 kg/m   Physical Exam Vitals and nursing note reviewed.  Constitutional:      Appearance: Normal appearance.  HENT:     Head: Normocephalic and atraumatic.  Eyes:     General: No scleral icterus.       Right eye: No discharge.        Left eye: No discharge.     Extraocular Movements: Extraocular movements intact.     Conjunctiva/sclera: Conjunctivae normal.     Pupils: Pupils are equal, round, and reactive to light.  Cardiovascular:     Rate and Rhythm: Normal rate.     Pulses: Normal pulses.  Skin:    General: Skin is warm and dry.     Capillary Refill: Capillary refill takes less than 2 seconds.  Neurological:     General: No focal deficit  present.     Mental Status: He is alert and oriented to person, place, and time.  Psychiatric:        Mood and Affect: Mood normal.        Behavior: Behavior normal.        Thought Content: Thought content normal.        Judgment: Judgment normal.      Ortho Exam Right hand laceration again near the thumb and near the index finger with swelling he has a lot of stiffness cannot really flex his fingers  Very tender to touch.  No evidence of erythema or streaking lymphangitis or purulence or abscess  Specialty Comments:  No specialty comments available.  Imaging: No results found.   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  Past  Surgical History:  Procedure Laterality Date   BICEPT TENODESIS     Social History   Occupational History   Not on file  Tobacco Use   Smoking status: Never   Smokeless tobacco: Former    Types: Chew  Substance and Sexual Activity   Alcohol use: No   Drug use: Yes    Types: Marijuana    Comment: last used 4 weeks ago   Sexual activity: Not on file

## 2023-02-28 ENCOUNTER — Encounter: Payer: Self-pay | Admitting: Orthopedic Surgery

## 2023-02-28 ENCOUNTER — Ambulatory Visit: Payer: Medicaid Other | Admitting: Orthopedic Surgery

## 2023-02-28 DIAGNOSIS — W540XXD Bitten by dog, subsequent encounter: Secondary | ICD-10-CM | POA: Diagnosis not present

## 2023-02-28 DIAGNOSIS — W540XXA Bitten by dog, initial encounter: Secondary | ICD-10-CM | POA: Insufficient documentation

## 2023-02-28 DIAGNOSIS — M79641 Pain in right hand: Secondary | ICD-10-CM

## 2023-02-28 NOTE — Patient Instructions (Addendum)
Stop splint move the hand more. Try to make a fist or squeeze the hand throughout the day   Continue antibiotics  Continue elevation  Follow-up 2 weeks

## 2023-02-28 NOTE — Progress Notes (Signed)
Chief Complaint  Patient presents with   Hand Pain    Has pain over knuckles of hand after dog bite 02/18/23   42 year old male was involved in a dog bite we saw him on the 22nd he was bit on the 19th he was placed on Augmentin he is here to check the wounds.  Patient continues to improve he has some pain over the index finger but no signs of redness or infection he has intact finger extension in all digits he has some weird feeling in his wrist and hand but nothing to suggest tenosynovitis  Recommend elevation  Active range of motion  Continue antibiotics Augmentin  Follow-up in 2 weeks

## 2023-03-14 ENCOUNTER — Ambulatory Visit: Payer: Medicaid Other | Admitting: Orthopedic Surgery

## 2023-03-14 DIAGNOSIS — W540XXD Bitten by dog, subsequent encounter: Secondary | ICD-10-CM

## 2023-07-08 ENCOUNTER — Inpatient Hospital Stay (HOSPITAL_COMMUNITY)
Admission: EM | Admit: 2023-07-08 | Discharge: 2023-07-10 | DRG: 682 | Disposition: A | Discharge status: Home / Self Care | Payer: Medicaid Other | Attending: Internal Medicine | Admitting: Internal Medicine

## 2023-07-08 ENCOUNTER — Encounter (HOSPITAL_COMMUNITY): Payer: Self-pay

## 2023-07-08 ENCOUNTER — Other Ambulatory Visit: Payer: Self-pay

## 2023-07-08 ENCOUNTER — Emergency Department (HOSPITAL_COMMUNITY): Payer: Medicaid Other

## 2023-07-08 DIAGNOSIS — N179 Acute kidney failure, unspecified: Secondary | ICD-10-CM | POA: Diagnosis not present

## 2023-07-08 DIAGNOSIS — U071 COVID-19: Secondary | ICD-10-CM | POA: Insufficient documentation

## 2023-07-08 DIAGNOSIS — N132 Hydronephrosis with renal and ureteral calculous obstruction: Secondary | ICD-10-CM | POA: Diagnosis present

## 2023-07-08 DIAGNOSIS — Z91018 Allergy to other foods: Secondary | ICD-10-CM

## 2023-07-08 DIAGNOSIS — Z881 Allergy status to other antibiotic agents status: Secondary | ICD-10-CM

## 2023-07-08 DIAGNOSIS — Z79899 Other long term (current) drug therapy: Secondary | ICD-10-CM

## 2023-07-08 DIAGNOSIS — Z720 Tobacco use: Secondary | ICD-10-CM

## 2023-07-08 DIAGNOSIS — I10 Essential (primary) hypertension: Secondary | ICD-10-CM | POA: Diagnosis present

## 2023-07-08 DIAGNOSIS — Z888 Allergy status to other drugs, medicaments and biological substances status: Secondary | ICD-10-CM

## 2023-07-08 DIAGNOSIS — N201 Calculus of ureter: Principal | ICD-10-CM | POA: Insufficient documentation

## 2023-07-08 DIAGNOSIS — E871 Hypo-osmolality and hyponatremia: Secondary | ICD-10-CM | POA: Diagnosis present

## 2023-07-08 DIAGNOSIS — E860 Dehydration: Secondary | ICD-10-CM | POA: Diagnosis present

## 2023-07-08 LAB — URINALYSIS, W/ REFLEX TO CULTURE (INFECTION SUSPECTED)
Bilirubin Urine: NEGATIVE
Glucose, UA: NEGATIVE mg/dL
Ketones, ur: 20 mg/dL — AB
Leukocytes,Ua: NEGATIVE
Nitrite: NEGATIVE
Protein, ur: 100 mg/dL — AB
Specific Gravity, Urine: 1.039 — ABNORMAL HIGH (ref 1.005–1.030)
pH: 5 (ref 5.0–8.0)

## 2023-07-08 LAB — CBC WITH DIFFERENTIAL/PLATELET
Abs Immature Granulocytes: 0.01 10*3/uL (ref 0.00–0.07)
Basophils Absolute: 0 10*3/uL (ref 0.0–0.1)
Basophils Relative: 0 %
Eosinophils Absolute: 0 10*3/uL (ref 0.0–0.5)
Eosinophils Relative: 0 %
HCT: 43.5 % (ref 39.0–52.0)
Hemoglobin: 14.5 g/dL (ref 13.0–17.0)
Immature Granulocytes: 0 %
Lymphocytes Relative: 8 %
Lymphs Abs: 0.4 10*3/uL — ABNORMAL LOW (ref 0.7–4.0)
MCH: 31 pg (ref 26.0–34.0)
MCHC: 33.3 g/dL (ref 30.0–36.0)
MCV: 93.1 fL (ref 80.0–100.0)
Monocytes Absolute: 0.5 10*3/uL (ref 0.1–1.0)
Monocytes Relative: 10 %
Neutro Abs: 4 10*3/uL (ref 1.7–7.7)
Neutrophils Relative %: 82 %
Platelets: 152 10*3/uL (ref 150–400)
RBC: 4.67 MIL/uL (ref 4.22–5.81)
RDW: 11.9 % (ref 11.5–15.5)
WBC: 4.9 10*3/uL (ref 4.0–10.5)
nRBC: 0 % (ref 0.0–0.2)

## 2023-07-08 LAB — LIPASE, BLOOD: Lipase: 29 U/L (ref 11–51)

## 2023-07-08 LAB — COMPREHENSIVE METABOLIC PANEL
ALT: 23 U/L (ref 0–44)
AST: 29 U/L (ref 15–41)
Albumin: 4.7 g/dL (ref 3.5–5.0)
Alkaline Phosphatase: 58 U/L (ref 38–126)
Anion gap: 9 (ref 5–15)
BUN: 20 mg/dL (ref 6–20)
CO2: 25 mmol/L (ref 22–32)
Calcium: 9.1 mg/dL (ref 8.9–10.3)
Chloride: 100 mmol/L (ref 98–111)
Creatinine, Ser: 1.66 mg/dL — ABNORMAL HIGH (ref 0.61–1.24)
GFR, Estimated: 52 mL/min — ABNORMAL LOW (ref >60)
Glucose, Bld: 132 mg/dL — ABNORMAL HIGH (ref 70–99)
Potassium: 4.5 mmol/L (ref 3.5–5.1)
Sodium: 134 mmol/L — ABNORMAL LOW (ref 135–145)
Total Bilirubin: 0.7 mg/dL (ref 0.3–1.2)
Total Protein: 8.1 g/dL (ref 6.5–8.1)

## 2023-07-08 LAB — SARS CORONAVIRUS 2 BY RT PCR: SARS Coronavirus 2 by RT PCR: POSITIVE — AB

## 2023-07-08 MED ORDER — HYDROMORPHONE HCL 1 MG/ML IJ SOLN
1.0000 mg | Freq: Once | INTRAMUSCULAR | Status: AC
  Administered 2023-07-08: 1 mg via INTRAVENOUS
  Filled 2023-07-08: qty 1

## 2023-07-08 MED ORDER — HYDROMORPHONE HCL 1 MG/ML IJ SOLN
1.0000 mg | INTRAMUSCULAR | Status: DC | PRN
  Administered 2023-07-09 (×4): 1 mg via INTRAVENOUS
  Filled 2023-07-08 (×5): qty 1

## 2023-07-08 MED ORDER — LACTATED RINGERS IV SOLN: INTRAVENOUS | Status: DC

## 2023-07-08 MED ORDER — TAMSULOSIN HCL 0.4 MG PO CAPS
0.4000 mg | ORAL_CAPSULE | Freq: Every day | ORAL | Status: DC
  Administered 2023-07-08 – 2023-07-09 (×2): 0.4 mg via ORAL
  Filled 2023-07-08 (×2): qty 1

## 2023-07-08 MED ORDER — RIVAROXABAN 10 MG PO TABS
10.0000 mg | ORAL_TABLET | Freq: Every day | ORAL | Status: DC
  Administered 2023-07-08 – 2023-07-10 (×3): 10 mg via ORAL
  Filled 2023-07-08 (×3): qty 1

## 2023-07-08 MED ORDER — LACTATED RINGERS IV BOLUS
1000.0000 mL | Freq: Once | INTRAVENOUS | Status: AC
  Administered 2023-07-08: 1000 mL via INTRAVENOUS

## 2023-07-08 MED ORDER — METOCLOPRAMIDE HCL 5 MG/ML IJ SOLN
10.0000 mg | Freq: Once | INTRAMUSCULAR | Status: AC
  Administered 2023-07-08: 10 mg via INTRAVENOUS
  Filled 2023-07-08: qty 2

## 2023-07-08 MED ORDER — OXYCODONE HCL 5 MG PO TABS
10.0000 mg | ORAL_TABLET | ORAL | Status: DC | PRN
  Administered 2023-07-08 – 2023-07-10 (×4): 10 mg via ORAL
  Filled 2023-07-08 (×4): qty 2

## 2023-07-08 MED ORDER — ONDANSETRON HCL 4 MG/2ML IJ SOLN
4.0000 mg | Freq: Once | INTRAMUSCULAR | Status: AC
  Administered 2023-07-08: 4 mg via INTRAVENOUS
  Filled 2023-07-08: qty 2

## 2023-07-08 MED ORDER — ACETAMINOPHEN 500 MG PO TABS
1000.0000 mg | ORAL_TABLET | Freq: Once | ORAL | Status: AC
  Administered 2023-07-08: 1000 mg via ORAL
  Filled 2023-07-08: qty 2

## 2023-07-08 MED ORDER — ONDANSETRON HCL 4 MG/2ML IJ SOLN
4.0000 mg | Freq: Four times a day (QID) | INTRAMUSCULAR | Status: DC | PRN
  Administered 2023-07-08: 4 mg via INTRAVENOUS
  Filled 2023-07-08: qty 2

## 2023-07-08 MED ORDER — ONDANSETRON HCL 4 MG PO TABS
4.0000 mg | ORAL_TABLET | Freq: Four times a day (QID) | ORAL | Status: DC | PRN
  Administered 2023-07-09: 4 mg via ORAL
  Filled 2023-07-08: qty 1

## 2023-07-08 MED ORDER — HYDROMORPHONE HCL 1 MG/ML IJ SOLN
0.5000 mg | Freq: Once | INTRAMUSCULAR | Status: AC
  Administered 2023-07-08: 0.5 mg via INTRAVENOUS
  Filled 2023-07-08: qty 0.5

## 2023-07-08 MED ORDER — NIRMATRELVIR/RITONAVIR (PAXLOVID)TABLET
3.0000 | ORAL_TABLET | Freq: Two times a day (BID) | ORAL | Status: DC
  Administered 2023-07-08 – 2023-07-10 (×4): 3 via ORAL
  Filled 2023-07-08: qty 30

## 2023-07-08 NOTE — ED Provider Notes (Signed)
[place] EMERGENCY DEPARTMENT AT [facility] Provider Note   CSN: [id number] Arrival date & time: 07/08/23  1132     History  Chief Complaint  Patient presents with   Flank Pain    Corey Ballard is a 42 y.o. male with PMH as listed below who presents with R flank pain.  Pt reports he woke up in the middle of the night with severe flank pain, nausea/vomiting unable to take PO, and a productive cough and generalized body aches/chills.  Has no history of similar pain.  Most severe pain he is ever felt.  He cannot get comfortable, tried heat, Tylenol and ibuprofen, getting into a bath but none of it helped.  He denies any radiation of the pain, denies back pain or pain in his groin.  States he has had decreased urinary output because he has not been able to eat or drink anything.  Denies any blood in his urine.  Denies any history of kidney stones or abdominal surgery.  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 Authorizing Provider  amoxicillin-clavulanate (AUGMENTIN) 875-125 MG tablet Take 1 tablet by mouth every 12 (twelve) hours. 02/21/23   Vickki Hearing, MD  meloxicam (MOBIC) 15 MG tablet Take 1 tablet (15 mg total) by mouth daily as needed for pain. 09/01/18   Raeford Razor, MD  oxyCODONE-acetaminophen (PERCOCET/ROXICET) 5-325 MG tablet Take 1 tablet by mouth every 6 (six) hours as needed for severe pain. 02/18/23   Mesner, Barbara Cower, MD      Allergies    Erythromycin and Ketorolac tromethamine    Review of Systems   Review of Systems A 10 point review of systems was performed and is negative unless otherwise reported in HPI.  Physical Exam Updated Vital Signs BP (!) 154/72 (BP Location: Right Arm)   Pulse (!) 52   Temp 98.3 F (36.8 C)  (Oral)   Resp 16   Ht 5\' 11"  (1.803 m)   Wt 95.3 kg   SpO2 99%   BMI 29.29 kg/m  Physical Exam Abdominal:       Comments: Motions to pain in this area    General: Uncomfortable-appearing male, lying in bed.  HEENT: Sclera anicteric, MMM, trachea midline.  Cardiology: RRR, no murmurs/rubs/gallops. BL radial and DP pulses equal bilaterally.  Resp: Normal respiratory rate and effort. CTAB, no wheezes, rhonchi, crackles.  Abd: Soft, non-tender, non-distended. No rebound tenderness or guarding.  GU: Deferred. MSK: No peripheral edema or signs of trauma. Extremities without deformity or TTP. No cyanosis or clubbing. Skin: warm, dry. No rashes or lesions. Back: +R CVA tenderness Neuro: A&Ox4, CNs II-XII grossly intact. MAEs. Sensation grossly intact.  Psych: Normal mood and affect.     ED Results / Procedures / Treatments   Labs (all labs ordered are listed, but only abnormal results are displayed) Labs Reviewed  SARS CORONAVIRUS 2 BY RT PCR - Abnormal; Notable for the following components:      Result Value   SARS Coronavirus 2 by RT PCR POSITIVE (*)    All other components within normal limits  CBC WITH DIFFERENTIAL/PLATELET - Abnormal; Notable for the following components:   Lymphs Abs 0.4 (*)  All other components within normal limits  URINALYSIS, W/ REFLEX TO CULTURE (INFECTION SUSPECTED) - Abnormal; Notable for the following components:   APPearance TURBID (*)    Specific Gravity, Urine 1.039 (*)    Hgb urine dipstick SMALL (*)    Ketones, ur 20 (*)    Protein, ur 100 (*)    Bacteria, UA RARE (*)    All other components within normal limits  COMPREHENSIVE METABOLIC PANEL - Abnormal; Notable for the following components:   Sodium 134 (*)    Glucose, Bld 132 (*)    Creatinine, Ser 1.66 (*)    GFR, Estimated 52 (*)    All other components within normal limits  LIPASE, BLOOD    EKG None  Radiology DG Chest 2 View  Result Date: 07/08/2023 CLINICAL DATA:   Productive cough and right flank pain. EXAM: CHEST - 2 VIEW COMPARISON:  None Available. FINDINGS: The heart size and mediastinal contours are within normal limits. Minimal linear atelectasis and/or scarring in the lingula. No focal consolidation, pleural effusion, or pneumothorax. No acute osseous abnormality. IMPRESSION: No active cardiopulmonary disease. Electronically Signed   By: Obie Dredge M.D.   On: 07/08/2023 15:15   CT Renal Stone Study  Result Date: 07/08/2023 CLINICAL DATA:  Right flank pain.  Body aches yesterday. EXAM: CT ABDOMEN AND PELVIS WITHOUT CONTRAST TECHNIQUE: Multidetector CT imaging of the abdomen and pelvis was performed following the standard protocol without IV contrast. RADIATION DOSE REDUCTION: This exam was performed according to the departmental dose-optimization program which includes automated exposure control, adjustment of the mA and/or kV according to patient size and/or use of iterative reconstruction technique. COMPARISON:  CT abdomen pelvis dated February 15, 2016. FINDINGS: Lower chest: No acute abnormality. Mild subsegmental atelectasis and/or scarring at both lung bases. Hepatobiliary: No focal liver abnormality is seen. No gallstones, gallbladder wall thickening, or biliary dilatation. Pancreas: Unremarkable. No pancreatic ductal dilatation or surrounding inflammatory changes. Spleen: Normal in size without focal abnormality. Adrenals/Urinary Tract: Adrenal glands and left kidney are unremarkable. 3 mm calculus at or just beyond the right UVJ with resultant mild right hydroureteronephrosis and perinephric/periureteral stranding. The bladder is mostly decompressed. Stomach/Bowel: Stomach is within normal limits. Appendix appears normal. No evidence of bowel wall thickening, distention, or inflammatory changes. Vascular/Lymphatic: No significant vascular findings are present. No enlarged abdominal or pelvic lymph nodes. Reproductive: Prostate is unremarkable. Other: No  abdominal wall hernia or abnormality. No abdominopelvic ascites. No pneumoperitoneum. Musculoskeletal: No acute or significant osseous findings. IMPRESSION: 1. 3 mm calculus at or just beyond the right UVJ with resultant mild right hydroureteronephrosis. Electronically Signed   By: Obie Dredge M.D.   On: 07/08/2023 15:14    Procedures Procedures    Medications Ordered in ED Medications  metoCLOPramide (REGLAN) injection 10 mg (has no administration in time range)  HYDROmorphone (DILAUDID) injection 0.5 mg (has no administration in time range)  HYDROmorphone (DILAUDID) injection 1 mg (1 mg Intravenous Given 07/08/23 1319)  ondansetron (ZOFRAN) injection 4 mg (4 mg Intravenous Given 07/08/23 1318)  lactated ringers bolus 1,000 mL (1,000 mLs Intravenous Bolus 07/08/23 1323)  acetaminophen (TYLENOL) tablet 1,000 mg (1,000 mg Oral Given 07/08/23 1457)  lactated ringers bolus 1,000 mL (1,000 mLs Intravenous Bolus 07/08/23 1458)    ED Course/ Medical Decision Making/ A&P                          Medical Decision Making Amount and/or Complexity of Data Reviewed Labs:  ordered. Decision-making details documented in ED Course. Radiology: ordered. Decision-making details documented in ED Course.  Risk OTC drugs. Prescription drug management. Decision regarding hospitalization.    This patient presents to the ED for concern of flank pain, this involves an extensive number of treatment options, and is a complaint that carries with it a high risk of complications and morbidity.  I considered the following differential and admission for this acute, potentially life threatening condition.   MDM:    DDX for flank pain includes but is not limited to:  Greatest concern in this patient for ureterolithiasis.  He does have right flank pain and decreased urinary output, this could represent pyelonephritis or obstructive uropathy.  He does not have any abdominal tenderness to palpation that he does motion to  pain sensation in the right lower quadrant, so must also consider appendicitis.  Lower concern for other causes of abdominal or flank pain such as biliary colic or pancreatitis.  No pain on the left side of the abdomen to indicate peptic ulcer disease or diverticulitis.  No inguinal hernia demonstrated on exam.  He also reports generalized bodyaches and chills but has no other flu or cold-like symptoms including sore throat, nasal congestion, rhinorrhea but does have a productive cough.  Will evaluate with viral testing and a chest x-ray to look for covid or a pneumonia or parapneumonic effusion causing flank pain.  Will evaluate with labs and CT renal stone study.  He is afebrile, not tachypneic and in no respiratory distress, no tachycardia to indicate SIRS.  Clinical Course as of 07/08/23 1538  Fri Jul 08, 2023  1423 Urinalysis, w/ Reflex to Culture (Infection Suspected) -Urine, Clean Catch(!) +proteinuria and ketonuria but no signs of infection. He is also afebrile, no tachycardia, no leukocytosis, no SIRS.  [HN]  1424 SARS Coronavirus 2 by RT PCR(!): POSITIVE +covid positive. [HN]  1424 Lipase: 29 neg [HN]  1424 Creatinine(!): 1.66 +AKI. Received fluids IVF.  [HN]  1424 Comprehensive metabolic panel(!) No significant electrolyte derangements. [HN]  1527 CT Renal Stone Study 3 mm calculus at or just beyond the right UVJ with resultant mild right hydroureteronephrosis.   [HN]  1536 Pt with significant AKI w/ BL Cr ~1 up to 1.66 and he has failed PO challenge here in the ED after zofran.  His AKI is likely prerenal and not obstructive as his ureteral stone is not obstructive.  He still is having significant and has covid-19 as well. I am concerned about his ability to orally rehydrate at home and worried that his AKI may worsen.  Patient's symptoms are also difficult to control and I believe the patient would benefit from a admission.  On shared decision making with the patient, patient agrees.   Will consult to hospitalist for admission. [HN]    Clinical Course User Index [HN] Loetta Rough, MD    Labs: I Ordered, and personally interpreted labs.  The pertinent results include:  those listed above  Imaging Studies ordered: I ordered imaging studies including CT renal stone study I independently visualized and interpreted imaging. I agree with the radiologist interpretation  Additional history obtained from chart review.    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)      Medicines Meds ordered this encounter  Medications   HYDROmorphone (DILAUDID) injection 1 mg   ondansetron (ZOFRAN) injection 4 mg   lactated ringers bolus 1,000 mL   acetaminophen (TYLENOL) tablet 1,000 mg   lactated ringers bolus 1,000 mL   metoCLOPramide (REGLAN) injection 10 mg   HYDROmorphone (DILAUDID) injection 0.5 mg    I have reviewed the patients home medicines and have made adjustments as needed  Problem List / ED Course: Problem List Items Addressed This Visit   None Visit Diagnoses     Ureterolithiasis    -  Primary   Relevant Medications   HYDROmorphone (DILAUDID) injection 1 mg (Completed)   HYDROmorphone (DILAUDID) injection 0.5 mg (Start on 07/08/2023  3:45 PM)   COVID-19       AKI (acute kidney injury) (HCC)       Dehydration                       This note was created using dictation software, which may contain spelling or grammatical errors.    Loetta Rough, MD 07/08/23 [phone]

## 2023-07-08 NOTE — Progress Notes (Addendum)
Alert and oriented , vitals stable on room air.  States not coughing much but does cough up green mucus at times.  Flank pain rated a 7 and requested pain medication and nausea med before eating supper.  Voided 150 mls clear yellow urine in urinal and strainier showed no kidney stone.  Visitors at bedside.

## 2023-07-08 NOTE — H&P (Signed)
History and Physical    Patient: Corey Ballard:[id number] DOB: 1981/10/30 DOA: 07/08/2023 DOS: the patient was seen and examined on 07/08/2023 PCP: Patient, No Pcp Per  Patient coming from: Home  Chief Complaint:  Chief Complaint  Patient presents with   Flank Pain   HPI: Corey Ballard is a 42 y.o. male with medical history significant for high blood pressure.  He presents to the hospital with severe right flank pain that started around midnight.  He has been unable to keep down oral food and liquids for the majority of the day today.  He tried to drink some vitamin water, but he immediately vomited this.  His pain has been severe.  He has been helped by IV pain medicine.  He has had a cough for the past 2 to 3 days.   Review of Systems: As mentioned in the history of present illness. All other systems reviewed and are negative. 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 He has quit using smokeless tobacco.  His smokeless tobacco use included chew. He reports that he does not currently use drugs after having used the following drugs: Marijuana. He reports that he does not drink alcohol.  Allergies  Allergen Reactions   Erythromycin Anaphylaxis   Ketorolac Tromethamine Shortness Of Breath   Alpha-Gal    Food Hives    Onions     History reviewed. No pertinent family history.  Prior to Admission medications   Medication Sig Start Date End Date Taking? Authorizing Provider  oxyCODONE-acetaminophen (PERCOCET/ROXICET) 5-325 MG tablet Take 1 tablet by mouth every 6 (six) hours as needed for severe pain. 02/18/23   Marily Memos, MD    Physical Exam: Vitals:   07/08/23 1515 07/08/23 1515 07/08/23 1530 07/08/23 1551  BP: (!) 154/72 (!)  154/72 (!) 144/88 (!) 152/78  Pulse: (!) 52 (!) 52    Resp:      Temp:  98.3 F (36.8 C)    TempSrc:  Oral    SpO2: 99% 99% 95%   Weight:      Height:       General: middle age male. Awake and alert and oriented x3. No acute cardiopulmonary distress.  HEENT: Normocephalic atraumatic.  Right and left ears normal in appearance.  Pupils equal, round, reactive to light. Extraocular muscles are intact. Sclerae anicteric and noninjected.  Moist mucosal membranes. No mucosal lesions.  Neck: Neck supple without lymphadenopathy. No carotid bruits. No masses palpated.  Cardiovascular: Regular rate with normal S1-S2 sounds. No murmurs, rubs, gallops auscultated. No JVD.  Respiratory: Good respiratory effort with no wheezes, rales, rhonchi. Lungs clear to auscultation bilaterally.  No accessory muscle use. Abdomen: Soft, right flank and right side tenderness. Nondistended. Active bowel sounds. No masses or hepatosplenomegaly  Skin: No rashes, lesions, or ulcerations.  Dry, warm to touch. 2+ dorsalis pedis and radial pulses. Musculoskeletal: No calf or leg pain. All major joints not erythematous nontender.  No upper or lower joint deformation.  Good ROM.  No contractures  Psychiatric: Intact judgment and insight. Pleasant and cooperative. Neurologic: No focal neurological deficits. Strength is 5/5 and symmetric in upper and lower extremities.  Cranial nerves II  through XII are grossly intact.  Data Reviewed: Results for orders placed or performed during the hospital encounter of 07/08/23 (from the past 24 hour(s))  CBC with Differential     Status: Abnormal   Collection Time: 07/08/23 12:43 PM  Result Value Ref Range   WBC 4.9 4.0 - 10.5 K/uL   RBC 4.67 4.22 - 5.81 MIL/uL   Hemoglobin 14.5 13.0 - 17.0 g/dL   HCT 01.0 27.2 - 53.6 %   MCV 93.1 80.0 - 100.0 fL   MCH 31.0 26.0 - 34.0 pg   MCHC 33.3 30.0 - 36.0 g/dL   RDW 64.4 03.4 - 74.2 %   Platelets 152 150 - 400 K/uL   nRBC 0.0 0.0 - 0.2 %    Neutrophils Relative % 82 %   Neutro Abs 4.0 1.7 - 7.7 K/uL   Lymphocytes Relative 8 %   Lymphs Abs 0.4 (L) 0.7 - 4.0 K/uL   Monocytes Relative 10 %   Monocytes Absolute 0.5 0.1 - 1.0 K/uL   Eosinophils Relative 0 %   Eosinophils Absolute 0.0 0.0 - 0.5 K/uL   Basophils Relative 0 %   Basophils Absolute 0.0 0.0 - 0.1 K/uL   Immature Granulocytes 0 %   Abs Immature Granulocytes 0.01 0.00 - 0.07 K/uL  Comprehensive metabolic panel     Status: Abnormal   Collection Time: 07/08/23 12:43 PM  Result Value Ref Range   Sodium 134 (L) 135 - 145 mmol/L   Potassium 4.5 3.5 - 5.1 mmol/L   Chloride 100 98 - 111 mmol/L   CO2 25 22 - 32 mmol/L   Glucose, Bld 132 (H) 70 - 99 mg/dL   BUN 20 6 - 20 mg/dL   Creatinine, Ser 5.95 (H) 0.61 - 1.24 mg/dL   Calcium 9.1 8.9 - 63.8 mg/dL   Total Protein 8.1 6.5 - 8.1 g/dL   Albumin 4.7 3.5 - 5.0 g/dL   AST 29 15 - 41 U/L   ALT 23 0 - 44 U/L   Alkaline Phosphatase 58 38 - 126 U/L   Total Bilirubin 0.7 0.3 - 1.2 mg/dL   GFR, Estimated 52 (L) >60 mL/min   Anion gap 9 5 - 15  Lipase, blood     Status: None   Collection Time: 07/08/23  1:11 PM  Result Value Ref Range   Lipase 29 11 - 51 U/L  Urinalysis, w/ Reflex to Culture (Infection Suspected) -Urine, Clean Catch     Status: Abnormal   Collection Time: 07/08/23  1:26 PM  Result Value Ref Range   Specimen Source URINE, CLEAN CATCH    Color, Urine YELLOW YELLOW   APPearance TURBID (A) CLEAR   Specific Gravity, Urine 1.039 (H) 1.005 - 1.030   pH 5.0 5.0 - 8.0   Glucose, UA NEGATIVE NEGATIVE mg/dL   Hgb urine dipstick SMALL (A) NEGATIVE   Bilirubin Urine NEGATIVE NEGATIVE   Ketones, ur 20 (A) NEGATIVE mg/dL   Protein, ur 756 (A) NEGATIVE mg/dL   Nitrite NEGATIVE NEGATIVE   Leukocytes,Ua NEGATIVE NEGATIVE   RBC / HPF 0-5 0 - 5 RBC/hpf   WBC, UA 0-5 0 - 5 WBC/hpf   Bacteria, UA RARE (A) NONE SEEN   Squamous Epithelial / HPF 0-5 0 - 5 /HPF   Amorphous Crystal PRESENT   SARS Coronavirus 2 by RT PCR  (hospital order, performed in [facility] hospital lab) *cepheid single result test* Anterior Nasal Swab     Status: Abnormal   Collection Time:  07/08/23  1:26 PM   Specimen: Anterior Nasal Swab  Result Value Ref Range   SARS Coronavirus 2 by RT PCR POSITIVE (A) NEGATIVE   DG Chest 2 View  Result Date: 07/08/2023 CLINICAL DATA:  Productive cough and right flank pain. EXAM: CHEST - 2 VIEW COMPARISON:  None Available. FINDINGS: The heart size and mediastinal contours are within normal limits. Minimal linear atelectasis and/or scarring in the lingula. No focal consolidation, pleural effusion, or pneumothorax. No acute osseous abnormality. IMPRESSION: No active cardiopulmonary disease. Electronically Signed   By: Obie Dredge M.D.   On: 07/08/2023 15:15   CT Renal Stone Study  Result Date: 07/08/2023 CLINICAL DATA:  Right flank pain.  Body aches yesterday. EXAM: CT ABDOMEN AND PELVIS WITHOUT CONTRAST TECHNIQUE: Multidetector CT imaging of the abdomen and pelvis was performed following the standard protocol without IV contrast. RADIATION DOSE REDUCTION: This exam was performed according to the departmental dose-optimization program which includes automated exposure control, adjustment of the mA and/or kV according to patient size and/or use of iterative reconstruction technique. COMPARISON:  CT abdomen pelvis dated February 15, 2016. FINDINGS: Lower chest: No acute abnormality. Mild subsegmental atelectasis and/or scarring at both lung bases. Hepatobiliary: No focal liver abnormality is seen. No gallstones, gallbladder wall thickening, or biliary dilatation. Pancreas: Unremarkable. No pancreatic ductal dilatation or surrounding inflammatory changes. Spleen: Normal in size without focal abnormality. Adrenals/Urinary Tract: Adrenal glands and left kidney are unremarkable. 3 mm calculus at or just beyond the right UVJ with resultant mild right hydroureteronephrosis and perinephric/periureteral stranding. The  bladder is mostly decompressed. Stomach/Bowel: Stomach is within normal limits. Appendix appears normal. No evidence of bowel wall thickening, distention, or inflammatory changes. Vascular/Lymphatic: No significant vascular findings are present. No enlarged abdominal or pelvic lymph nodes. Reproductive: Prostate is unremarkable. Other: No abdominal wall hernia or abnormality. No abdominopelvic ascites. No pneumoperitoneum. Musculoskeletal: No acute or significant osseous findings. IMPRESSION: 1. 3 mm calculus at or just beyond the right UVJ with resultant mild right hydroureteronephrosis. Electronically Signed   By: Obie Dredge M.D.   On: 07/08/2023 15:14     Assessment and Plan: No notes have been filed under this hospital service. Service: Hospitalist  Principal Problem:   AKI (acute kidney injury) (HCC) Active Problems:   COVID-19 virus infection   Ureterolithiasis  AKI secondary to poor p.o. intake and vomiting Observation on MedSurg Recheck BMP in the morning IV fluids COVID 19 Currently on room air.  Question whether the patient's stone is the cause of all of the patient's vomiting.   Ureterolithiasis Pain control IV fluids Flomax   Advance Care Planning:   Code Status: Full Code   Consults:   Family Communication: Patient's support person present  Severity of Illness: The appropriate patient status for this patient is OBSERVATION. Observation status is judged to be reasonable and necessary in order to provide the required intensity of service to ensure the patient's safety. The patient's presenting symptoms, physical exam findings, and initial radiographic and laboratory data in the context of their medical condition is felt to place them at decreased risk for further clinical deterioration. Furthermore, it is anticipated that the patient will be medically stable for discharge from the hospital within 2 midnights of admission.   Author: Levie Heritage, DO 07/08/2023 4:53  PM  For on call review [email or web address].

## 2023-07-08 NOTE — ED Triage Notes (Signed)
Pt reports he woke up yesterday with a productive cough and generalized body aches.  Pt reports body aches gone except for bad pain in his right flank.

## 2023-07-09 DIAGNOSIS — N132 Hydronephrosis with renal and ureteral calculous obstruction: Secondary | ICD-10-CM | POA: Diagnosis present

## 2023-07-09 DIAGNOSIS — Z91018 Allergy to other foods: Secondary | ICD-10-CM | POA: Diagnosis not present

## 2023-07-09 DIAGNOSIS — Z888 Allergy status to other drugs, medicaments and biological substances status: Secondary | ICD-10-CM | POA: Diagnosis not present

## 2023-07-09 DIAGNOSIS — Z720 Tobacco use: Secondary | ICD-10-CM | POA: Diagnosis not present

## 2023-07-09 DIAGNOSIS — E860 Dehydration: Secondary | ICD-10-CM | POA: Diagnosis present

## 2023-07-09 DIAGNOSIS — I10 Essential (primary) hypertension: Secondary | ICD-10-CM | POA: Diagnosis present

## 2023-07-09 DIAGNOSIS — Z79899 Other long term (current) drug therapy: Secondary | ICD-10-CM | POA: Diagnosis not present

## 2023-07-09 DIAGNOSIS — Z881 Allergy status to other antibiotic agents status: Secondary | ICD-10-CM | POA: Diagnosis not present

## 2023-07-09 DIAGNOSIS — N201 Calculus of ureter: Secondary | ICD-10-CM | POA: Diagnosis not present

## 2023-07-09 DIAGNOSIS — E871 Hypo-osmolality and hyponatremia: Secondary | ICD-10-CM | POA: Diagnosis present

## 2023-07-09 DIAGNOSIS — U071 COVID-19: Secondary | ICD-10-CM | POA: Diagnosis present

## 2023-07-09 DIAGNOSIS — N179 Acute kidney failure, unspecified: Secondary | ICD-10-CM | POA: Diagnosis present

## 2023-07-09 LAB — COMPREHENSIVE METABOLIC PANEL
ALT: 20 U/L (ref 0–44)
AST: 30 U/L (ref 15–41)
Albumin: 3.4 g/dL — ABNORMAL LOW (ref 3.5–5.0)
Alkaline Phosphatase: 44 U/L (ref 38–126)
Anion gap: 6 (ref 5–15)
BUN: 15 mg/dL (ref 6–20)
CO2: 27 mmol/L (ref 22–32)
Calcium: 8.1 mg/dL — ABNORMAL LOW (ref 8.9–10.3)
Chloride: 101 mmol/L (ref 98–111)
Creatinine, Ser: 1.09 mg/dL (ref 0.61–1.24)
GFR, Estimated: >60 mL/min (ref >60)
Glucose, Bld: 92 mg/dL (ref 70–99)
Potassium: 3.5 mmol/L (ref 3.5–5.1)
Sodium: 134 mmol/L — ABNORMAL LOW (ref 135–145)
Total Bilirubin: 0.3 mg/dL (ref 0.3–1.2)
Total Protein: 6.2 g/dL — ABNORMAL LOW (ref 6.5–8.1)

## 2023-07-09 LAB — D-DIMER, QUANTITATIVE: D-Dimer, Quant: <0.27 ug{FEU}/mL (ref 0.00–0.50)

## 2023-07-09 LAB — CBC
HCT: 39.2 % (ref 39.0–52.0)
Hemoglobin: 13 g/dL (ref 13.0–17.0)
MCH: 31 pg (ref 26.0–34.0)
MCHC: 33.2 g/dL (ref 30.0–36.0)
MCV: 93.3 fL (ref 80.0–100.0)
Platelets: 130 10*3/uL — ABNORMAL LOW (ref 150–400)
RBC: 4.2 MIL/uL — ABNORMAL LOW (ref 4.22–5.81)
RDW: 11.9 % (ref 11.5–15.5)
WBC: 4.3 10*3/uL (ref 4.0–10.5)
nRBC: 0 % (ref 0.0–0.2)

## 2023-07-09 LAB — FERRITIN: Ferritin: 108 ng/mL (ref 24–336)

## 2023-07-09 LAB — HIV ANTIBODY (ROUTINE TESTING W REFLEX): HIV Screen 4th Generation wRfx: NONREACTIVE

## 2023-07-09 LAB — PROCALCITONIN: Procalcitonin: 0.12 ng/mL

## 2023-07-09 LAB — C-REACTIVE PROTEIN: CRP: 1.7 mg/dL — ABNORMAL HIGH (ref <1.0)

## 2023-07-09 LAB — LACTATE DEHYDROGENASE: LDH: 160 U/L (ref 98–192)

## 2023-07-09 MED ORDER — ALUM & MAG HYDROXIDE-SIMETH 200-200-20 MG/5ML PO SUSP
15.0000 mL | Freq: Four times a day (QID) | ORAL | Status: DC | PRN
  Administered 2023-07-09: 15 mL via ORAL
  Filled 2023-07-09: qty 30

## 2023-07-09 MED ORDER — ONDANSETRON HCL 4 MG/2ML IJ SOLN
4.0000 mg | Freq: Four times a day (QID) | INTRAMUSCULAR | Status: DC
  Administered 2023-07-09 – 2023-07-10 (×4): 4 mg via INTRAVENOUS
  Filled 2023-07-09 (×4): qty 2

## 2023-07-09 MED ORDER — ACETAMINOPHEN 325 MG PO TABS: 650.0000 mg | ORAL_TABLET | Freq: Four times a day (QID) | ORAL | Status: DC | PRN

## 2023-07-09 MED ORDER — POTASSIUM CHLORIDE CRYS ER 20 MEQ PO TBCR
20.0000 meq | EXTENDED_RELEASE_TABLET | Freq: Once | ORAL | Status: AC
  Administered 2023-07-09: 20 meq via ORAL
  Filled 2023-07-09: qty 1

## 2023-07-09 MED ORDER — SODIUM CHLORIDE 0.9 % IV SOLN: INTRAVENOUS | Status: AC

## 2023-07-09 NOTE — Plan of Care (Signed)

## 2023-07-09 NOTE — Plan of Care (Signed)

## 2023-07-09 NOTE — Discharge Instructions (Signed)
  Providers Accepting New Patients in [place], [place]    [facility] [address]  [place], [place] [address] [phone] Accepts most insurances  [facility] [address] [place], [place] [address] [phone] Accepts most insurances  [facility] [place] [facility] [address] [place], [place] [address]  [phone] Must meet requirements  [facility] [address]  [place], [place] [address] [phone] Accepts most insurances  [facility] [address]  [place], [place] [address] [phone] Accepts most insurances  [facility] [address]   [place], [place]   [phone] Accepts most insurances  [facility] ([facility])  [address]  [place], [place] [address] [phone] Accepts most insurances     [facility] [address]  [place], [place] [address] [phone] Accepts most insurances  [facility] [address] [place], [place] [address] [phone] option 1 Accepts Medicaid and Uninsured  [facility] [address]  [place], [place] [address] [phone] Accepts most insurances  Tesfaye Fanta, MD [address]  [place], [place] [address] [phone] Accepts most insurances  [facility] [place] [address]  [place], [place] [address] [phone] Accepts most insurances  [facility] [address] [place],  [address] [phone] Accepts most insurances  Zack Hall, MD [address] [place],  [address] [phone]  Accepts most insurances                      

## 2023-07-09 NOTE — Progress Notes (Addendum)
   07/09/23 1031  TOC Brief Assessment  Insurance and Status Reviewed  Patient has primary care physician Yes  Home environment has been reviewed Home with spouse  Prior level of function: Independent  Prior/Current Home Services No current home services  Social Determinants of Health Reivew SDOH reviewed no interventions necessary  Readmission risk has been reviewed Yes  Transition of care needs no transition of care needs at this time    Admitted with kidney stone and COVID, no needs identified. PCP list added to AVS. TOC following

## 2023-07-09 NOTE — Progress Notes (Signed)
PROGRESS NOTE  Corey Ballard:[id number] DOB: [birth year] DOA: 07/08/2023 PCP: Patient, No Pcp Per  Brief History:  42 year old male with  no documented chronic medical problems presenting with myalgias, arthralgias, and subjective fevers and chills with nonproductive cough that began on 07/07/2023.  By the next day, 07/08/2023, the patient developed right-sided flank pain that was colicky in nature with associated nausea and vomiting.  This pain continued to worsen.  As result, he presented for further evaluation and treatment.  He does not smoke.  He complains of dysuria.  He denied any chest pain, shortness of breath, hemoptysis, diarrhea, hematochezia, melena, hematuria. In the ED, the patient was afebrile and hemodynamically stable with oxygen saturation 97% room air.  WBC 4.9, hemoglobin 14.5, platelets 1 52,000.  Sodium 134, potassium 4.5, bicarbonate 25, serum creatinine 1.66.  ALT 29, ALT 23, alk phosphatase 58, total bilirubin 0.7.  UA was negative for pyuria.  CT renal stone protocol showed a 3 mm calculus at right UVJ with resultant mild hydronephrosis.  UA was negative for pyuria.  The patient was started on IV fluids and Flomax.  COVID PCR was positive.  The patient was started on Paxlovid.    Assessment/Plan:  Acute kidney injury -Second volume depletion -Baseline creatinine 1.0-1.1 -Presented with serum creatinine 1.66 -Start IV fluids>>improving  Right ureteral stone -07/08/2023 CT renal stone protocol as discussed above -Continue Flomax -Continue IV opioids -Continue IV fluids -Hopeful the patient may be able to pass the stone  Hyponatremia -Secondary to from depletion -Continue IV fluids  COVID-19 infection -CRP  -PCT 0.12 -ferritin 108 -LDH 160 -Continue Paxlovid -stable on RA  Intractable nausea and vomiting -Full liquid diet for now -Around-the-clock antiemetics          Family Communication:   significant other at bedside  Consultants:   none  Code Status:  FULL  DVT Prophylaxis:  Xarelto   Procedures: As Listed in Progress Note Above  Antibiotics: None       Subjective: Patient states that his right flank pain is about 30% better.  He still has some nausea.  His emesis is better.  He denies any headache, chest pain, shortness breath, hemoptysis, diarrhea, hematochezia, melena.  Objective: Vitals:   07/09/23 0006 07/09/23 0418 07/09/23 0446 07/09/23 0820  BP: 108/72 117/86 111/83 119/80  Pulse: 67 64 (!) 57 (!) 58  Resp: 18  20 18   Temp: 98.1 F (36.7 C) 98.1 F (36.7 C) 98.4 F (36.9 C) 98.4 F (36.9 C)  TempSrc: Oral Oral Oral Oral  SpO2: 99% 98% 97% 98%  Weight:      Height:        Intake/Output Summary (Last 24 hours) at 07/09/2023 0954 Last data filed at 07/09/2023 0530 Gross per 24 hour  Intake 390 ml  Output 800 ml  Net -410 ml   Weight change:  Exam:  General:  Pt is alert, follows commands appropriately, not in acute distress HEENT: No icterus, No thrush, No neck mass, [place]/AT Cardiovascular: RRR, S1/S2, no rubs, no gallops Respiratory: CTA bilaterally, no wheezing, no crackles, no rhonchi Abdomen: Soft/+BS, RLQ tender, non distended, no guarding; right flank pain Extremities: No edema, No lymphangitis, No petechiae, No rashes, no synovitis   Data Reviewed: I have personally reviewed following labs and imaging studies Basic Metabolic Panel: Recent Labs  Lab 07/08/23 1243  NA 134*  K 4.5  CL 100  CO2 25  GLUCOSE 132*  BUN  20  CREATININE 1.66*  CALCIUM 9.1   Liver Function Tests: Recent Labs  Lab 07/08/23 1243  AST 29  ALT 23  ALKPHOS 58  BILITOT 0.7  PROT 8.1  ALBUMIN 4.7   Recent Labs  Lab 07/08/23 1311  LIPASE 29   No results for input(s): "AMMONIA" in the last 168 hours. Coagulation Profile: No results for input(s): "INR", "PROTIME" in the last 168 hours. CBC: Recent Labs  Lab 07/08/23 1243  WBC 4.9  NEUTROABS 4.0  HGB 14.5  HCT 43.5  MCV 93.1   PLT 152   Cardiac Enzymes: No results for input(s): "CKTOTAL", "CKMB", "CKMBINDEX", "TROPONINI" in the last 168 hours. BNP: Invalid input(s): "POCBNP" CBG: No results for input(s): "GLUCAP" in the last 168 hours. HbA1C: No results for input(s): "HGBA1C" in the last 72 hours. Urine analysis:    Component Value Date/Time   COLORURINE YELLOW 07/08/2023 1326   APPEARANCEUR TURBID (A) 07/08/2023 1326   LABSPEC 1.039 (H) 07/08/2023 1326   PHURINE 5.0 07/08/2023 1326   GLUCOSEU NEGATIVE 07/08/2023 1326   HGBUR SMALL (A) 07/08/2023 1326   BILIRUBINUR NEGATIVE 07/08/2023 1326   KETONESUR 20 (A) 07/08/2023 1326   PROTEINUR 100 (A) 07/08/2023 1326   UROBILINOGEN 0.2 05/31/2015 1236   NITRITE NEGATIVE 07/08/2023 1326   LEUKOCYTESUR NEGATIVE 07/08/2023 1326   Sepsis Labs: @LABRCNTIP (procalcitonin:4,lacticidven:4) ) Recent Results (from the past 240 hour(s))  SARS Coronavirus 2 by RT PCR (hospital order, performed in [facility] hospital lab) *cepheid single result test* Anterior Nasal Swab     Status: Abnormal   Collection Time: 07/08/23  1:26 PM   Specimen: Anterior Nasal Swab  Result Value Ref Range Status   SARS Coronavirus 2 by RT PCR POSITIVE (A) NEGATIVE Final    Comment: (NOTE) SARS-CoV-2 target nucleic acids are DETECTED  SARS-CoV-2 RNA is generally detectable in upper respiratory specimens  during the acute phase of infection.  Positive results are indicative  of the presence of the identified virus, but do not rule out bacterial infection or co-infection with other pathogens not detected by the test.  Clinical correlation with patient history and  other diagnostic information is necessary to determine patient infection status.  The expected result is negative.  Fact Sheet for Patients:   RoadLapTop.co.za   Fact Sheet for Healthcare Providers:   http://kim-miller.com/    This test is not yet approved or cleared by the  [place] FDA and  has been authorized for detection and/or diagnosis of SARS-CoV-2 by FDA under an Emergency Use Authorization (EUA).  This EUA will remain in effect (meaning this test can be used) for the duration of  the COVID-19 declaration under Section 564(b)(1)  of the Act, 21 U.S.C. section 360-bbb-3(b)(1), unless the authorization is terminated or revoked sooner.   Performed at [facility], [address], [place], [place] [address]      Scheduled Meds:  nirmatrelvir/ritonavir  3 tablet Oral BID   rivaroxaban  10 mg Oral Daily   tamsulosin  0.4 mg Oral QPC supper   Continuous Infusions:  lactated ringers 125 mL/hr at 07/09/23 0447    Procedures/Studies: DG Chest 2 View  Result Date: 07/08/2023 CLINICAL DATA:  Productive cough and right flank pain. EXAM: CHEST - 2 VIEW COMPARISON:  None Available. FINDINGS: The heart size and mediastinal contours are within normal limits. Minimal linear atelectasis and/or scarring in the lingula. No focal consolidation, pleural effusion, or pneumothorax. No acute osseous abnormality. IMPRESSION: No active cardiopulmonary disease. Electronically Signed  By: Obie Dredge M.D.   On: 07/08/2023 15:15   CT Renal Stone Study  Result Date: 07/08/2023 CLINICAL DATA:  Right flank pain.  Body aches yesterday. EXAM: CT ABDOMEN AND PELVIS WITHOUT CONTRAST TECHNIQUE: Multidetector CT imaging of the abdomen and pelvis was performed following the standard protocol without IV contrast. RADIATION DOSE REDUCTION: This exam was performed according to the departmental dose-optimization program which includes automated exposure control, adjustment of the mA and/or kV according to patient size and/or use of iterative reconstruction technique. COMPARISON:  CT abdomen pelvis dated February 15, 2016. FINDINGS: Lower chest: No acute abnormality. Mild subsegmental atelectasis and/or scarring at both lung bases. Hepatobiliary: No focal liver abnormality is seen. No  gallstones, gallbladder wall thickening, or biliary dilatation. Pancreas: Unremarkable. No pancreatic ductal dilatation or surrounding inflammatory changes. Spleen: Normal in size without focal abnormality. Adrenals/Urinary Tract: Adrenal glands and left kidney are unremarkable. 3 mm calculus at or just beyond the right UVJ with resultant mild right hydroureteronephrosis and perinephric/periureteral stranding. The bladder is mostly decompressed. Stomach/Bowel: Stomach is within normal limits. Appendix appears normal. No evidence of bowel wall thickening, distention, or inflammatory changes. Vascular/Lymphatic: No significant vascular findings are present. No enlarged abdominal or pelvic lymph nodes. Reproductive: Prostate is unremarkable. Other: No abdominal wall hernia or abnormality. No abdominopelvic ascites. No pneumoperitoneum. Musculoskeletal: No acute or significant osseous findings. IMPRESSION: 1. 3 mm calculus at or just beyond the right UVJ with resultant mild right hydroureteronephrosis. Electronically Signed   By: Obie Dredge M.D.   On: 07/08/2023 15:14    Catarina Hartshorn, DO  [facility]  If 7PM-7AM, please contact night-coverage [email or web address] Password [email or web address] 07/09/2023, 9:54 AM   LOS: 0 days

## 2023-07-09 NOTE — Hospital Course (Signed)
42 year old male with  no documented chronic medical problems presenting with myalgias, arthralgias, and subjective fevers and chills with nonproductive cough that began on 07/07/2023.  By the next day, 07/08/2023, the patient developed right-sided flank pain that was colicky in nature with associated nausea and vomiting.  This pain continued to worsen.  As result, he presented for further evaluation and treatment.  He does not smoke.  He complains of dysuria.  He denied any chest pain, shortness of breath, hemoptysis, diarrhea, hematochezia, melena, hematuria. In the ED, the patient was afebrile and hemodynamically stable with oxygen saturation 97% room air.  WBC 4.9, hemoglobin 14.5, platelets 1 52,000.  Sodium 134, potassium 4.5, bicarbonate 25, serum creatinine 1.66.  ALT 29, ALT 23, alk phosphatase 58, total bilirubin 0.7.  UA was negative for pyuria.  CT renal stone protocol showed a 3 mm calculus at right UVJ with resultant mild hydronephrosis.  UA was negative for pyuria.  The patient was started on IV fluids and Flomax.  COVID PCR was positive.  The patient was started on Paxlovid. The patient was admitted secondary to his uncontrolled pain and concerns for his ability to tolerate p.o.  The patient was started on IV fluids and opioids.  He improved gradually although he had not yet passed a stone.  He was able to tolerate p.o. intake at the time of discharge.  Case was discussed with urology who felt the patient could follow-up in the office and did not need any current operative intervention.

## 2023-07-10 DIAGNOSIS — N179 Acute kidney failure, unspecified: Secondary | ICD-10-CM | POA: Diagnosis not present

## 2023-07-10 DIAGNOSIS — U071 COVID-19: Secondary | ICD-10-CM | POA: Diagnosis not present

## 2023-07-10 DIAGNOSIS — N201 Calculus of ureter: Secondary | ICD-10-CM

## 2023-07-10 LAB — CBC
HCT: 39.2 % (ref 39.0–52.0)
Hemoglobin: 12.9 g/dL — ABNORMAL LOW (ref 13.0–17.0)
MCH: 31.2 pg (ref 26.0–34.0)
MCHC: 32.9 g/dL (ref 30.0–36.0)
MCV: 94.7 fL (ref 80.0–100.0)
Platelets: 122 10*3/uL — ABNORMAL LOW (ref 150–400)
RBC: 4.14 MIL/uL — ABNORMAL LOW (ref 4.22–5.81)
RDW: 11.9 % (ref 11.5–15.5)
WBC: 3.4 10*3/uL — ABNORMAL LOW (ref 4.0–10.5)
nRBC: 0 % (ref 0.0–0.2)

## 2023-07-10 LAB — BASIC METABOLIC PANEL
Anion gap: 6 (ref 5–15)
BUN: 11 mg/dL (ref 6–20)
CO2: 28 mmol/L (ref 22–32)
Calcium: 8.1 mg/dL — ABNORMAL LOW (ref 8.9–10.3)
Chloride: 102 mmol/L (ref 98–111)
Creatinine, Ser: 1.04 mg/dL (ref 0.61–1.24)
GFR, Estimated: >60 mL/min (ref >60)
Glucose, Bld: 88 mg/dL (ref 70–99)
Potassium: 3.8 mmol/L (ref 3.5–5.1)
Sodium: 136 mmol/L (ref 135–145)

## 2023-07-10 LAB — MAGNESIUM: Magnesium: 2 mg/dL (ref 1.7–2.4)

## 2023-07-10 LAB — C-REACTIVE PROTEIN: CRP: 0.8 mg/dL (ref <1.0)

## 2023-07-10 MED ORDER — ONDANSETRON 4 MG PO TBDP: 4.0000 mg | ORAL_TABLET | Freq: Three times a day (TID) | ORAL | 0 refills | Status: AC | PRN

## 2023-07-10 MED ORDER — OXYCODONE HCL 10 MG PO TABS: 10.0000 mg | ORAL_TABLET | ORAL | 0 refills | Status: AC | PRN

## 2023-07-10 MED ORDER — TAMSULOSIN HCL 0.4 MG PO CAPS: 0.4000 mg | ORAL_CAPSULE | Freq: Every day | ORAL | 0 refills | Status: AC

## 2023-07-10 MED ORDER — NIRMATRELVIR/RITONAVIR (PAXLOVID)TABLET: 3.0000 | ORAL_TABLET | Freq: Two times a day (BID) | ORAL | Status: AC

## 2023-07-10 NOTE — Discharge Summary (Addendum)
Physician Discharge Summary   Patient: Corey Ballard MRN: [id number] DOB: 02-Jul-1981  Admit date:     07/08/2023  Discharge date: 07/10/23  Discharge Physician: Onalee Hua Dorise Gangi   PCP: Patient, No Pcp Per   Recommendations at discharge:   Please follow up with primary care provider within 1-2 weeks  Please repeat BMP and CBC in one week   Hospital Course: 42 year old male with  no documented chronic medical problems presenting with myalgias, arthralgias, and subjective fevers and chills with nonproductive cough that began on 07/07/2023.  By the next day, 07/08/2023, the patient developed right-sided flank pain that was colicky in nature with associated nausea and vomiting.  This pain continued to worsen.  As result, he presented for further evaluation and treatment.  He does not smoke.  He complains of dysuria.  He denied any chest pain, shortness of breath, hemoptysis, diarrhea, hematochezia, melena, hematuria. In the ED, the patient was afebrile and hemodynamically stable with oxygen saturation 97% room air.  WBC 4.9, hemoglobin 14.5, platelets 1 52,000.  Sodium 134, potassium 4.5, bicarbonate 25, serum creatinine 1.66.  ALT 29, ALT 23, alk phosphatase 58, total bilirubin 0.7.  UA was negative for pyuria.  CT renal stone protocol showed a 3 mm calculus at right UVJ with resultant mild hydronephrosis.  UA was negative for pyuria.  The patient was started on IV fluids and Flomax.  COVID PCR was positive.  The patient was started on Paxlovid. The patient was admitted secondary to his uncontrolled pain and concerns for his ability to tolerate p.o.  The patient was started on IV fluids and opioids.  He improved gradually although he had not yet passed a stone.  He was able to tolerate p.o. intake at the time of discharge.  Case was discussed with urology who felt the patient could follow-up in the office and did not need any current operative intervention.   Assessment and Plan: Acute kidney injury -Second  volume depletion -Baseline creatinine 1.0-1.1 -Presented with serum creatinine 1.66 -Start IV fluids>>improving -Serum creatinine 1.04 on the day of discharge   Right ureteral stone -07/08/2023 CT renal stone protocol as discussed above -Continue Flomax -Continue IV opioids>> DC home with oxycodone 10 mg every 4 hours as needed pain -Continue IV fluids -Pain is better controlled, but has not yet passed a stone -Case discussed with urology, Dr. Elon Alas to d/c with follow up with Dr. Ronne Binning -able to tolerate po   Hyponatremia -Secondary to from depletion -Continue IV fluids   COVID-19 infection -CRP 1.7>> -PCT 0.12 -ferritin 108 -LDH 160 -Continue Paxlovid x 3 more days after d/c -stable on RA   Intractable nausea and vomiting -Full liquid diet for now>>regular diet -Around-the-clock antiemetics initially  -now tolerating diet at time of dc                 Consultants: none Procedures performed: none  Disposition: Home Diet recommendation:  Regular diet DISCHARGE MEDICATION: Allergies as of 07/10/2023       Reactions   Erythromycin Anaphylaxis   Ketorolac Tromethamine Shortness Of Breath   Alpha-gal    Food Hives   Onions        Medication List     TAKE these medications    nirmatrelvir/ritonavir 20 x 150 MG & 10 x 100MG  Tabs Commonly known as: PAXLOVID Take 3 tablets by mouth 2 (two) times daily for 5 days. Patient GFR is >60. Take nirmatrelvir (150 mg) two tablets twice daily for 5 days and ritonavir (  100 mg) one tablet twice daily for 3 more days.   ondansetron 4 MG disintegrating tablet Commonly known as: ZOFRAN-ODT Take 1 tablet (4 mg total) by mouth every 8 (eight) hours as needed for nausea or vomiting.   Oxycodone HCl 10 MG Tabs Take 1 tablet (10 mg total) by mouth every 4 (four) hours as needed for moderate pain.   tamsulosin 0.4 MG Caps capsule Commonly known as: FLOMAX Take 1 capsule (0.4 mg total) by mouth daily after supper.    UNKNOWN TO PATIENT Take 1 tablet by mouth daily as needed (Pain).        Follow-up Information     McKenzie, Mardene Celeste, MD. Call today.   Specialty: Urology Why: To make an appointment, For follow-up early next week Contact information: [address] [place] [address] [phone]         Go to  [facility] Emergency Department at [facility].   Specialty: Emergency Medicine Why: As needed, If symptoms worsen Contact information: [address] [place] [address] [phone]               Discharge Exam: Ceasar Mons Weights   07/08/23 1144  Weight: 95.3 kg   HEENT:  [place]/AT, No thrush, no icterus CV:  RRR, no rub, no S3, no S4 Lung:  CTA, no wheeze, no rhonchi Abd:  soft/+BS, NT Ext:  No edema, no lymphangitis, no synovitis, no rash   Condition at discharge: stable  The results of significant diagnostics from this hospitalization (including imaging, microbiology, ancillary and laboratory) are listed below for reference.   Imaging Studies: DG Chest 2 View  Result Date: 07/08/2023 CLINICAL DATA:  Productive cough and right flank pain. EXAM: CHEST - 2 VIEW COMPARISON:  None Available. FINDINGS: The heart size and mediastinal contours are within normal limits. Minimal linear atelectasis and/or scarring in the lingula. No focal consolidation, pleural effusion, or pneumothorax. No acute osseous abnormality. IMPRESSION: No active cardiopulmonary disease. Electronically Signed   By: Obie Dredge M.D.   On: 07/08/2023 15:15   CT Renal Stone Study  Result Date: 07/08/2023 CLINICAL DATA:  Right flank pain.  Body aches yesterday. EXAM: CT ABDOMEN AND PELVIS WITHOUT CONTRAST TECHNIQUE: Multidetector CT imaging of the abdomen and pelvis was performed following the standard protocol without IV contrast. RADIATION DOSE REDUCTION: This exam was performed according to the departmental dose-optimization program which includes automated  exposure control, adjustment of the mA and/or kV according to patient size and/or use of iterative reconstruction technique. COMPARISON:  CT abdomen pelvis dated February 15, 2016. FINDINGS: Lower chest: No acute abnormality. Mild subsegmental atelectasis and/or scarring at both lung bases. Hepatobiliary: No focal liver abnormality is seen. No gallstones, gallbladder wall thickening, or biliary dilatation. Pancreas: Unremarkable. No pancreatic ductal dilatation or surrounding inflammatory changes. Spleen: Normal in size without focal abnormality. Adrenals/Urinary Tract: Adrenal glands and left kidney are unremarkable. 3 mm calculus at or just beyond the right UVJ with resultant mild right hydroureteronephrosis and perinephric/periureteral stranding. The bladder is mostly decompressed. Stomach/Bowel: Stomach is within normal limits. Appendix appears normal. No evidence of bowel wall thickening, distention, or inflammatory changes. Vascular/Lymphatic: No significant vascular findings are present. No enlarged abdominal or pelvic lymph nodes. Reproductive: Prostate is unremarkable. Other: No abdominal wall hernia or abnormality. No abdominopelvic ascites. No pneumoperitoneum. Musculoskeletal: No acute or significant osseous findings. IMPRESSION: 1. 3 mm calculus at or just beyond the right UVJ with resultant mild right hydroureteronephrosis. Electronically Signed  By: Obie Dredge M.D.   On: 07/08/2023 15:14    Microbiology: Results for orders placed or performed during the hospital encounter of 07/08/23  SARS Coronavirus 2 by RT PCR (hospital order, performed in [facility] hospital lab) *cepheid single result test* Anterior Nasal Swab     Status: Abnormal   Collection Time: 07/08/23  1:26 PM   Specimen: Anterior Nasal Swab  Result Value Ref Range Status   SARS Coronavirus 2 by RT PCR POSITIVE (A) NEGATIVE Final    Comment: (NOTE) SARS-CoV-2 target nucleic acids are DETECTED  SARS-CoV-2 RNA is generally  detectable in upper respiratory specimens  during the acute phase of infection.  Positive results are indicative  of the presence of the identified virus, but do not rule out bacterial infection or co-infection with other pathogens not detected by the test.  Clinical correlation with patient history and  other diagnostic information is necessary to determine patient infection status.  The expected result is negative.  Fact Sheet for Patients:   RoadLapTop.co.za   Fact Sheet for Healthcare Providers:   http://kim-miller.com/    This test is not yet approved or cleared by the [place] FDA and  has been authorized for detection and/or diagnosis of SARS-CoV-2 by FDA under an Emergency Use Authorization (EUA).  This EUA will remain in effect (meaning this test can be used) for the duration of  the COVID-19 declaration under Section 564(b)(1)  of the Act, 21 U.S.C. section 360-bbb-3(b)(1), unless the authorization is terminated or revoked sooner.   Performed at [facility], [address], [place], [place] [address]     Labs: CBC: Recent Labs  Lab 07/08/23 1243 07/09/23 1015 07/10/23 0503  WBC 4.9 4.3 3.4*  NEUTROABS 4.0  --   --   HGB 14.5 13.0 12.9*  HCT 43.5 39.2 39.2  MCV 93.1 93.3 94.7  PLT 152 130* 122*   Basic Metabolic Panel: Recent Labs  Lab 07/08/23 1243 07/09/23 1015 07/10/23 0503  NA 134* 134* 136  K 4.5 3.5 3.8  CL 100 101 102  CO2 25 27 28   GLUCOSE 132* 92 88  BUN 20 15 11   CREATININE 1.66* 1.09 1.04  CALCIUM 9.1 8.1* 8.1*  MG  --   --  2.0   Liver Function Tests: Recent Labs  Lab 07/08/23 1243 07/09/23 1015  AST 29 30  ALT 23 20  ALKPHOS 58 44  BILITOT 0.7 0.3  PROT 8.1 6.2*  ALBUMIN 4.7 3.4*   CBG: No results for input(s): "GLUCAP" in the last 168 hours.  Discharge time spent: greater than 30 minutes.  Signed: Catarina Hartshorn, MD [facility] 07/10/2023

## 2023-07-10 NOTE — Plan of Care (Signed)
